# Patient Record
Sex: Male | Born: 1975 | Race: Black or African American | Hispanic: No | Marital: Married | Smoking: Never smoker
Health system: Southern US, Community
[De-identification: ages and names within clinical notes are randomized; demographics above are authoritative.]

---

## 2005-12-13 HISTORY — PX: KNEE ARTHROSCOPY: SUR90

## 2014-06-05 ENCOUNTER — Ambulatory Visit (INDEPENDENT_AMBULATORY_CARE_PROVIDER_SITE_OTHER): Payer: Commercial Indemnity

## 2014-06-05 DIAGNOSIS — Z23 Encounter for immunization: Secondary | ICD-10-CM

## 2014-06-05 DIAGNOSIS — B20 Human immunodeficiency virus [HIV] disease: Secondary | ICD-10-CM

## 2014-06-05 LAB — CBC WITH DIFFERENTIAL/PLATELET
Basophils Absolute: 0 10*3/uL (ref 0.0–0.1)
Basophils Relative: 1 % (ref 0–1)
EOS PCT: 1 % (ref 0–5)
Eosinophils Absolute: 0 10*3/uL (ref 0.0–0.7)
HCT: 40.9 % (ref 39.0–52.0)
HEMOGLOBIN: 14 g/dL (ref 13.0–17.0)
Lymphocytes Relative: 42 % (ref 12–46)
Lymphs Abs: 1.6 10*3/uL (ref 0.7–4.0)
MCH: 31.6 pg (ref 26.0–34.0)
MCHC: 34.2 g/dL (ref 30.0–36.0)
MCV: 92.3 fL (ref 78.0–100.0)
MONOS PCT: 7 % (ref 3–12)
Monocytes Absolute: 0.3 10*3/uL (ref 0.1–1.0)
NEUTROS ABS: 1.9 10*3/uL (ref 1.7–7.7)
Neutrophils Relative %: 49 % (ref 43–77)
Platelets: 243 10*3/uL (ref 150–400)
RBC: 4.43 MIL/uL (ref 4.22–5.81)
RDW: 13.1 % (ref 11.5–15.5)
WBC: 3.9 10*3/uL — ABNORMAL LOW (ref 4.0–10.5)

## 2014-06-05 LAB — LIPID PANEL
CHOL/HDL RATIO: 2.2 ratio
Cholesterol: 139 mg/dL (ref 0–200)
HDL: 63 mg/dL (ref >39)
LDL CALC: 67 mg/dL (ref 0–99)
TRIGLYCERIDES: 45 mg/dL (ref <150)
VLDL: 9 mg/dL (ref 0–40)

## 2014-06-05 LAB — COMPLETE METABOLIC PANEL WITH GFR
ALBUMIN: 4.5 g/dL (ref 3.5–5.2)
ALT: 22 U/L (ref 0–53)
AST: 24 U/L (ref 0–37)
Alkaline Phosphatase: 53 U/L (ref 39–117)
BUN: 14 mg/dL (ref 6–23)
CALCIUM: 9.6 mg/dL (ref 8.4–10.5)
CHLORIDE: 105 meq/L (ref 96–112)
CO2: 29 mEq/L (ref 19–32)
CREATININE: 1.03 mg/dL (ref 0.50–1.35)
GFR, Est African American: >89 mL/min
GFR, Est Non African American: >89 mL/min
GLUCOSE: 82 mg/dL (ref 70–99)
POTASSIUM: 4.5 meq/L (ref 3.5–5.3)
Sodium: 140 mEq/L (ref 135–145)
Total Bilirubin: 0.5 mg/dL (ref 0.2–1.2)
Total Protein: 7.3 g/dL (ref 6.0–8.3)

## 2014-06-06 LAB — HEPATITIS C ANTIBODY: HCV Ab: NEGATIVE

## 2014-06-06 LAB — HEPATITIS B SURFACE ANTIGEN: HEP B S AG: NEGATIVE

## 2014-06-06 LAB — RPR

## 2014-06-06 LAB — T-HELPER CELL (CD4) - (RCID CLINIC ONLY)
CD4 % Helper T Cell: 46 % (ref 33–55)
CD4 T Cell Abs: 710 /uL (ref 400–2700)

## 2014-06-06 LAB — HEPATITIS A ANTIBODY, TOTAL: Hep A Total Ab: REACTIVE — AB

## 2014-06-06 LAB — HEPATITIS B CORE ANTIBODY, TOTAL: Hep B Core Total Ab: NONREACTIVE

## 2014-06-06 LAB — HEPATITIS B SURFACE ANTIBODY,QUALITATIVE: Hep B S Ab: POSITIVE — AB

## 2014-06-07 LAB — HIV-1 RNA ULTRAQUANT REFLEX TO GENTYP+
HIV 1 RNA Quant: 3320 copies/mL — ABNORMAL HIGH (ref <20)
HIV-1 RNA Quant, Log: 3.52 {Log} — ABNORMAL HIGH (ref <1.30)

## 2014-06-11 LAB — HLA B*5701: HLA-B*5701 w/rflx HLA-B High: NEGATIVE

## 2014-06-13 NOTE — Progress Notes (Signed)
Patient is here today after testing positive for HIV on 05-20-14. His last negative test was 03-2013 . He states he is "devastated" by this new diagnosis and is focusing on getting treatment.   He was married to a male for several years and after the breakup he became sexually active with males. He has no tattoos or piecings. No medical records.  Vaccines updated.   Laurell Josephsammy K Chandlor Noecker, RN

## 2014-06-19 LAB — HIV-1 GENOTYPR PLUS

## 2014-06-24 ENCOUNTER — Ambulatory Visit: Payer: Commercial Indemnity | Admitting: Internal Medicine

## 2014-07-08 ENCOUNTER — Telehealth: Payer: Self-pay | Admitting: Licensed Clinical Social Worker

## 2014-07-08 NOTE — Telephone Encounter (Signed)
Will make sure to treat him on that day

## 2014-07-08 NOTE — Telephone Encounter (Signed)
Tamika from the Health Department was checking to make sure patient is treated for syphilis because he was listed as a contact from a positive syphilis patient. Patient is coming in on 7/29

## 2014-07-10 ENCOUNTER — Encounter: Payer: Self-pay | Admitting: Internal Medicine

## 2014-07-10 ENCOUNTER — Ambulatory Visit (INDEPENDENT_AMBULATORY_CARE_PROVIDER_SITE_OTHER): Payer: Commercial Indemnity | Admitting: Internal Medicine

## 2014-07-10 VITALS — BP 117/75 | HR 51 | Temp 97.3°F | Wt 156.0 lb

## 2014-07-10 DIAGNOSIS — Z202 Contact with and (suspected) exposure to infections with a predominantly sexual mode of transmission: Secondary | ICD-10-CM | POA: Insufficient documentation

## 2014-07-10 DIAGNOSIS — B20 Human immunodeficiency virus [HIV] disease: Secondary | ICD-10-CM | POA: Insufficient documentation

## 2014-07-10 DIAGNOSIS — Z21 Asymptomatic human immunodeficiency virus [HIV] infection status: Secondary | ICD-10-CM | POA: Insufficient documentation

## 2014-07-10 DIAGNOSIS — A539 Syphilis, unspecified: Secondary | ICD-10-CM

## 2014-07-10 MED ORDER — PENICILLIN G BENZATHINE 1200000 UNIT/2ML IM SUSP
1.2000 10*6.[IU] | Freq: Once | INTRAMUSCULAR | Status: AC
  Administered 2014-07-10: 1.2 10*6.[IU] via INTRAMUSCULAR

## 2014-07-10 MED ORDER — ABACAVIR-DOLUTEGRAVIR-LAMIVUD 600-50-300 MG PO TABS: 1.0000 | ORAL_TABLET | Freq: Every day | ORAL | Status: DC

## 2014-07-10 NOTE — Progress Notes (Signed)
Subjective:    Patient ID: Casandra DoffingRolondo Whitmoyer, male    DOB: 11/11/1976, 38 y.o.   MRN: [id number]  HPI  38yo M, moved from [place] ( where he lived there for 13 yrs) but family from [place] originally. He has recently diagnosed for HIV, and recently told he was a contact of syphilis, roughly 3 month. He is married to a woman and has 2 kids ( age 40 and 209 yo daughters). He has recently come to understand that his sexual preference is for men. He has been routinely tested for HIV in April 2015 (-), jan 2015 (-). He believes that the male partner he may have contracted HIV is a person that he had limited sexual contact, Abron being the insertive partner. Raydon was contacted by health dept to let him know that he was a sexual contact to syphilis case. He states that none of the male sexual contacts or his wife have tested positive. Last sexually active with his wife roughly 3 years ago. 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  He denies any recent flu like illness, or need to for antibiotics. No recent hospitalizations. He does not take medicines routinely ? Difficulty with swallowing pills. No health problems  No current outpatient prescriptions on file prior to visit.   No current facility-administered medications on file prior to visit.   No Known Allergies  Active Ambulatory Problems    Diagnosis Date Noted  . No Active Ambulatory Problems   Resolved Ambulatory Problems    Diagnosis Date Noted  . No Resolved Ambulatory Problems   No Additional Past Medical History   History  Substance Use Topics  . Smoking status: Never Smoker   . Smokeless tobacco: Not on file  . Alcohol Use: 1.2 oz/week    2 Glasses of wine per week  family history includes Cancer in his paternal grandmother; Diabetes in his father; Hypertension in his father.   Review of  Systems  Constitutional: Negative for fever, chills, diaphoresis, activity change, appetite change, fatigue and unexpected weight change.  HENT: Negative for congestion, sore throat, rhinorrhea, sneezing, trouble swallowing and sinus pressure.  Eyes: Negative for photophobia and visual disturbance.  Respiratory: Negative for cough, chest tightness, shortness of breath, wheezing and stridor.  Cardiovascular: Negative for chest pain, palpitations and leg swelling.  Gastrointestinal: Negative for nausea, vomiting, abdominal pain, diarrhea, constipation, blood in stool, abdominal distention and anal bleeding.  Genitourinary: Negative for dysuria, hematuria, flank pain and difficulty urinating.  Musculoskeletal: Negative for myalgias, back pain, joint swelling, arthralgias and gait problem.  Skin: Negative for color change, pallor, rash and wound.  Neurological: Negative for dizziness, tremors, weakness and light-headedness.  Hematological: Negative for adenopathy. Does not bruise/bleed easily.  Psychiatric/Behavioral: Negative for behavioral problems, confusion, sleep disturbance, dysphoric mood, decreased concentration and agitation.       Objective:   Physical Exam BP 117/75  Pulse 51  Temp(Src) 97.3 F (36.3 C) (Oral)  Wt 156 lb (70.761 kg) Physical Exam  Constitutional: He is oriented to person, place, and time. He appears well-developed and well-nourished. No distress.  HENT:  Mouth/Throat: Oropharynx is clear and moist. No oropharyngeal exudate.  Cardiovascular: Normal rate, regular rhythm and normal heart sounds. Exam reveals no gallop and no friction rub.  No murmur heard.  Pulmonary/Chest: Effort normal and breath sounds normal. No respiratory distress. He has no wheezes.  Abdominal: Soft. Bowel sounds  are normal. He exhibits no distension. There is no tenderness.  Lymphadenopathy:  He has no cervical adenopathy.  Neurological: He is alert and oriented to person, place, and  time.  Skin: Skin is warm and dry. No rash noted. No erythema.  Psychiatric: He has a normal mood and affect. His behavior is normal.   Labs: Lab Results  Component Value Date   CD4TABS 710 06/05/2014   Lab Results  Component Value Date   HIV1RNAQUANT 3320* 06/05/2014       Assessment & Plan:  38yo M with recent diagnosis of HIV, CD 4 count of 710/VL 3320, hep A immune, Hep B immune, interested in establishing care and starting ART.  HIV = will start him on triumeq since he does not want to gain weight as has been seen with stribild. Recommended apple sauce to help with swallowing meds. Reviewed the importance of adherence  Health maintenance = already received pneumococcal, to get flu vaccine in the Fall  Syphilis exposure = will give 1 shot of penicillin IM today  rtc in 5 wks

## 2014-08-14 ENCOUNTER — Ambulatory Visit (INDEPENDENT_AMBULATORY_CARE_PROVIDER_SITE_OTHER): Payer: Commercial Indemnity | Admitting: Internal Medicine

## 2014-08-14 ENCOUNTER — Encounter: Payer: Self-pay | Admitting: Internal Medicine

## 2014-08-14 VITALS — BP 114/66 | HR 52 | Temp 97.2°F | Wt 154.0 lb

## 2014-08-14 DIAGNOSIS — Z23 Encounter for immunization: Secondary | ICD-10-CM

## 2014-08-14 DIAGNOSIS — Z0000 Encounter for general adult medical examination without abnormal findings: Secondary | ICD-10-CM

## 2014-08-14 DIAGNOSIS — Z21 Asymptomatic human immunodeficiency virus [HIV] infection status: Secondary | ICD-10-CM

## 2014-08-14 LAB — CBC WITH DIFFERENTIAL/PLATELET
BASOS ABS: 0 10*3/uL (ref 0.0–0.1)
Basophils Relative: 1 % (ref 0–1)
Eosinophils Absolute: 0 10*3/uL (ref 0.0–0.7)
Eosinophils Relative: 1 % (ref 0–5)
HEMATOCRIT: 42.5 % (ref 39.0–52.0)
HEMOGLOBIN: 14.3 g/dL (ref 13.0–17.0)
Lymphocytes Relative: 34 % (ref 12–46)
Lymphs Abs: 1.4 10*3/uL (ref 0.7–4.0)
MCH: 32.1 pg (ref 26.0–34.0)
MCHC: 33.6 g/dL (ref 30.0–36.0)
MCV: 95.3 fL (ref 78.0–100.0)
MONOS PCT: 9 % (ref 3–12)
Monocytes Absolute: 0.4 10*3/uL (ref 0.1–1.0)
NEUTROS ABS: 2.3 10*3/uL (ref 1.7–7.7)
Neutrophils Relative %: 55 % (ref 43–77)
Platelets: 263 10*3/uL (ref 150–400)
RBC: 4.46 MIL/uL (ref 4.22–5.81)
RDW: 14.3 % (ref 11.5–15.5)
WBC: 4.1 10*3/uL (ref 4.0–10.5)

## 2014-08-14 LAB — URINALYSIS
Bilirubin Urine: NEGATIVE
Glucose, UA: NEGATIVE mg/dL
HGB URINE DIPSTICK: NEGATIVE
Ketones, ur: NEGATIVE mg/dL
LEUKOCYTES UA: NEGATIVE
NITRITE: NEGATIVE
Protein, ur: NEGATIVE mg/dL
SPECIFIC GRAVITY, URINE: 1.018 (ref 1.005–1.030)
Urobilinogen, UA: 0.2 mg/dL (ref 0.0–1.0)
pH: 6 (ref 5.0–8.0)

## 2014-08-14 LAB — COMPLETE METABOLIC PANEL WITH GFR
ALT: 29 U/L (ref 0–53)
AST: 28 U/L (ref 0–37)
Albumin: 4.3 g/dL (ref 3.5–5.2)
Alkaline Phosphatase: 55 U/L (ref 39–117)
BILIRUBIN TOTAL: 0.4 mg/dL (ref 0.2–1.2)
BUN: 22 mg/dL (ref 6–23)
CO2: 29 mEq/L (ref 19–32)
CREATININE: 1.35 mg/dL (ref 0.50–1.35)
Calcium: 9.3 mg/dL (ref 8.4–10.5)
Chloride: 102 mEq/L (ref 96–112)
GFR, Est African American: 75 mL/min
GFR, Est Non African American: 65 mL/min
Glucose, Bld: 75 mg/dL (ref 70–99)
Potassium: 4.5 mEq/L (ref 3.5–5.3)
Sodium: 138 mEq/L (ref 135–145)
Total Protein: 7.3 g/dL (ref 6.0–8.3)

## 2014-08-14 NOTE — Progress Notes (Signed)
Patient ID: Josecarlos Harriott, male   DOB: 1976-09-21, 38 y.o.   MRN: [id number]       Patient ID: Chaska Hagger, male   DOB: January 08, 1976, 38 y.o.   MRN: [id number]  HPI  38yo M with HIV disease, diagnosed in Spring 2015, CD 4 count of 710/VL3320. Also hx of syphilis exposure. We started him on ABC/DLG/lamivudine in late July. He has had good adherence. Repeats only missing 1 dose since starting meds, due to traveling.   Outpatient Encounter Prescriptions as of 08/14/2014  Medication Sig  . Abacavir-Dolutegravir-Lamivud (TRIUMEQ) 600-50-300 MG TABS Take 1 tablet by mouth daily.     Patient Active Problem List   Diagnosis Date Noted  . Asymptomatic HIV infection 07/10/2014  . Exposure to syphilis 07/10/2014     Health Maintenance Due  Topic Date Due  . Tetanus/tdap  05/21/1993  . Influenza Vaccine  07/13/2014     Review of Systems 10 point ros negative Physical Exam   BP 114/66  Pulse 52  Temp(Src) 97.2 F (36.2 C) (Oral)  Wt 154 lb (69.854 kg)  Constitutional: He is oriented to person, place, and time. He appears well-developed and well-nourished. No distress.  Skin: Skin is warm and dry. No rash noted. No erythema.  Psychiatric: He has a normal mood and affect. behavior is normal.    Lab Results  Component Value Date   CD4TCELL 46 06/05/2014   Lab Results  Component Value Date   CD4TABS 710 06/05/2014   Lab Results  Component Value Date   HIV1RNAQUANT 3320* 06/05/2014   Lab Results  Component Value Date   HEPBSAB POS* 06/05/2014   No results found for this basename: RPR    CBC Lab Results  Component Value Date   WBC 3.9* 06/05/2014   RBC 4.43 06/05/2014   HGB 14.0 06/05/2014   HCT 40.9 06/05/2014   PLT 243 06/05/2014   MCV 92.3 06/05/2014   MCH 31.6 06/05/2014   MCHC 34.2 06/05/2014   RDW 13.1 06/05/2014   LYMPHSABS 1.6 06/05/2014   MONOABS 0.3 06/05/2014   EOSABS 0.0 06/05/2014   BASOSABS 0.0 06/05/2014   BMET Lab Results  Component Value Date   NA 140 06/05/2014    K 4.5 06/05/2014   CL 105 06/05/2014   CO2 29 06/05/2014   GLUCOSE 82 06/05/2014   BUN 14 06/05/2014   CREATININE 1.03 06/05/2014   CALCIUM 9.6 06/05/2014   GFRNONAA >89 06/05/2014   GFRAA >89 06/05/2014     Assessment and Plan  hiv = will check labs today to see that he is virally suppressed. Continue on triomeq. Review importance of adherence. Gave keychain pill holder  Health maintenance = will get flu shot today, also check urine studies  Syphilis exposure = will check rpr at next visit  hiv prevention = promote condom use. Previously married with kids, but this is a more recent exposure due to sex with MSM.

## 2014-08-15 LAB — T-HELPER CELL (CD4) - (RCID CLINIC ONLY)
CD4 T CELL HELPER: 44 % (ref 33–55)
CD4 T Cell Abs: 630 /uL (ref 400–2700)

## 2014-08-16 LAB — HIV-1 RNA QUANT-NO REFLEX-BLD
HIV 1 RNA Quant: <20 copies/mL (ref <20)
HIV-1 RNA Quant, Log: <1.3 {Log} (ref <1.30)

## 2014-08-21 ENCOUNTER — Encounter: Payer: Self-pay | Admitting: Internal Medicine

## 2014-11-14 ENCOUNTER — Ambulatory Visit: Payer: Commercial Indemnity | Admitting: Internal Medicine

## 2014-12-06 ENCOUNTER — Telehealth: Payer: Self-pay | Admitting: Infectious Disease

## 2014-12-06 ENCOUNTER — Other Ambulatory Visit: Payer: Self-pay | Admitting: Infectious Disease

## 2014-12-06 DIAGNOSIS — T7840XA Allergy, unspecified, initial encounter: Secondary | ICD-10-CM

## 2014-12-06 MED ORDER — PREDNISONE 20 MG PO TABS: 40.0000 mg | ORAL_TABLET | Freq: Every day | ORAL | Status: DC

## 2014-12-06 NOTE — Telephone Encounter (Signed)
Patient called and sounds to be having a potential ABACAVIR Hypersentivity reaction despite HLA B701 negative.  He had prior sensation of rash but now head to toe rash and severe abdominal pain and diarrhea.   Rx cipro by PCP  I said stop TRIUMEQ (add to allergy list)  Start prednisone 53m daily  Zyrtec daily  Benadryl prn and to ED if worsens  He should be seen on Monday January 4th with labs CMP, CBC  c diff, RPR  and would change to GENVOYA likely

## 2014-12-09 ENCOUNTER — Other Ambulatory Visit: Payer: Commercial Indemnity

## 2014-12-09 DIAGNOSIS — Z113 Encounter for screening for infections with a predominantly sexual mode of transmission: Secondary | ICD-10-CM

## 2014-12-09 DIAGNOSIS — T7840XA Allergy, unspecified, initial encounter: Secondary | ICD-10-CM

## 2014-12-09 DIAGNOSIS — B20 Human immunodeficiency virus [HIV] disease: Secondary | ICD-10-CM

## 2014-12-09 DIAGNOSIS — Z21 Asymptomatic human immunodeficiency virus [HIV] infection status: Secondary | ICD-10-CM

## 2014-12-09 LAB — COMPLETE METABOLIC PANEL WITH GFR
ALBUMIN: 4 g/dL (ref 3.5–5.2)
ALK PHOS: 79 U/L (ref 39–117)
ALT: 22 U/L (ref 0–53)
AST: 21 U/L (ref 0–37)
BILIRUBIN TOTAL: 0.3 mg/dL (ref 0.2–1.2)
BUN: 10 mg/dL (ref 6–23)
CO2: 32 mEq/L (ref 19–32)
Calcium: 9.3 mg/dL (ref 8.4–10.5)
Chloride: 102 mEq/L (ref 96–112)
Creat: 1.16 mg/dL (ref 0.50–1.35)
GFR, EST NON AFRICAN AMERICAN: 78 mL/min
GFR, Est African American: >89 mL/min
GLUCOSE: 82 mg/dL (ref 70–99)
POTASSIUM: 4.4 meq/L (ref 3.5–5.3)
SODIUM: 139 meq/L (ref 135–145)
TOTAL PROTEIN: 6.7 g/dL (ref 6.0–8.3)

## 2014-12-09 NOTE — Telephone Encounter (Signed)
Per VD note from 12/06/14 called the patient to check on him and was advised the rash is better but that he will be out of town 12/12/14-12/18/14 and wants to come and have the labs now if possible. Advised him of the hours today and advised him to come when he can. Also advised him to watch out for the things he and Dr Daiva EvesVan Dam discussed 12/06/14.

## 2014-12-09 NOTE — Telephone Encounter (Signed)
Very good. We DO need to come up with a new reigmen for him though. He will need to do that when he comes back into town

## 2014-12-10 LAB — CBC WITH DIFFERENTIAL/PLATELET
BASOS ABS: 0 10*3/uL (ref 0.0–0.1)
Basophils Relative: 0 % (ref 0–1)
EOS PCT: 3 % (ref 0–5)
Eosinophils Absolute: 0.2 10*3/uL (ref 0.0–0.7)
HCT: 39.2 % (ref 39.0–52.0)
Hemoglobin: 13.3 g/dL (ref 13.0–17.0)
Lymphocytes Relative: 21 % (ref 12–46)
Lymphs Abs: 1.7 10*3/uL (ref 0.7–4.0)
MCH: 32.7 pg (ref 26.0–34.0)
MCHC: 33.9 g/dL (ref 30.0–36.0)
MCV: 96.3 fL (ref 78.0–100.0)
MPV: 9.1 fL — ABNORMAL LOW (ref 9.4–12.4)
Monocytes Absolute: 0.7 10*3/uL (ref 0.1–1.0)
Monocytes Relative: 9 % (ref 3–12)
Neutro Abs: 5.3 10*3/uL (ref 1.7–7.7)
Neutrophils Relative %: 67 % (ref 43–77)
PLATELETS: 289 10*3/uL (ref 150–400)
RBC: 4.07 MIL/uL — ABNORMAL LOW (ref 4.22–5.81)
RDW: 12.8 % (ref 11.5–15.5)
WBC: 7.9 10*3/uL (ref 4.0–10.5)

## 2014-12-10 LAB — RPR

## 2014-12-10 LAB — T-HELPER CELL (CD4) - (RCID CLINIC ONLY)
CD4 % Helper T Cell: 40 % (ref 33–55)
CD4 T CELL ABS: 660 /uL (ref 400–2700)

## 2014-12-10 NOTE — Telephone Encounter (Signed)
Patient was reminded of and encouraged to keep his appt set for 12/25/14 with Dr Drue SecondSnider. He advised he will do so and call if anything changes.

## 2014-12-12 LAB — HIV-1 RNA QUANT-NO REFLEX-BLD: HIV 1 RNA Quant: <20 copies/mL (ref <20)

## 2014-12-16 NOTE — Telephone Encounter (Signed)
Very good 

## 2014-12-25 ENCOUNTER — Ambulatory Visit (INDEPENDENT_AMBULATORY_CARE_PROVIDER_SITE_OTHER): Payer: Commercial Indemnity | Admitting: Internal Medicine

## 2014-12-25 ENCOUNTER — Encounter: Payer: Self-pay | Admitting: Internal Medicine

## 2014-12-25 VITALS — BP 132/74 | HR 65 | Temp 98.4°F | Wt 150.0 lb

## 2014-12-25 DIAGNOSIS — Z21 Asymptomatic human immunodeficiency virus [HIV] infection status: Secondary | ICD-10-CM

## 2014-12-25 DIAGNOSIS — M25541 Pain in joints of right hand: Secondary | ICD-10-CM

## 2014-12-25 DIAGNOSIS — L239 Allergic contact dermatitis, unspecified cause: Secondary | ICD-10-CM

## 2014-12-25 DIAGNOSIS — L200 Besnier's prurigo: Secondary | ICD-10-CM

## 2014-12-25 DIAGNOSIS — M79641 Pain in right hand: Secondary | ICD-10-CM

## 2014-12-25 DIAGNOSIS — M25542 Pain in joints of left hand: Secondary | ICD-10-CM

## 2014-12-25 DIAGNOSIS — M79642 Pain in left hand: Secondary | ICD-10-CM

## 2014-12-25 MED ORDER — ELVITEG-COBIC-EMTRICIT-TENOFDF 150-150-200-300 MG PO TABS: 1.0000 | ORAL_TABLET | Freq: Every day | ORAL | Status: DC

## 2014-12-25 NOTE — Progress Notes (Signed)
    Patient ID: Jerome Robbins, male   DOB: [birth year], 39 y.o.   MRN: [id number]  HPI 39yo M with HIV disease, had been on triomeq but over christmas develop full body pruritic rash which resolved with steroids. He was asked to stop taking triumeq Since there was concern that this could be related to ABC hypersensitivity although HLA B5701 negative. He had not had any new exposure to medicaitons, foods, detergents. He states that he recalls having epigastric abdominal pain and drying rash across brigde of nose weeks prior to developing full body rash. He is not recovered from the episode. He denies any residual rash at this time. Not having any epigastric pain, nor any diarrhea which he had for 3 days.  Outpatient Encounter Prescriptions as of 12/25/2014  Medication Sig  . elvitegravir-cobicistat-emtricitabine-tenofovir (STRIBILD) 150-150-200-300 MG TABS tablet Take 1 tablet by mouth daily with breakfast.  . [DISCONTINUED] predniSONE (DELTASONE) 20 MG tablet Take 2 tablets (40 mg total) by mouth daily with breakfast.     Patient Active Problem List   Diagnosis Date Noted  . Asymptomatic HIV infection 07/10/2014  . Exposure to syphilis 07/10/2014     Health Maintenance Due  Topic Date Due  . Samul Dada  05/21/1993     Review of Systems Bilateral achiness of fingers x 2 days.otherwise 10 point ros is negative  Physical Exam   BP 132/74 mmHg  Pulse 65  Temp(Src) 98.4 F (36.9 C) (Oral)  Wt 150 lb (68.04 kg) gen = a xo by 3 in nad HEENT = clear OP Skin = skin is intact, no visible rash Ext = joints of hands/fingers are not swollen or erythematous  Lab Results  Component Value Date   CD4TCELL 40 12/09/2014   Lab Results  Component Value Date   CD4TABS 660 12/09/2014   CD4TABS 630 08/14/2014   CD4TABS 710 06/05/2014   Lab Results  Component Value Date   HIV1RNAQUANT <20 12/09/2014   Lab Results  Component Value Date   HEPBSAB POS* 06/05/2014   No results found  for: RPR  CBC Lab Results  Component Value Date   WBC 7.9 12/09/2014   RBC 4.07* 12/09/2014   HGB 13.3 12/09/2014   HCT 39.2 12/09/2014   PLT 289 12/09/2014   MCV 96.3 12/09/2014   MCH 32.7 12/09/2014   MCHC 33.9 12/09/2014   RDW 12.8 12/09/2014   LYMPHSABS 1.7 12/09/2014   MONOABS 0.7 12/09/2014   EOSABS 0.2 12/09/2014   BASOSABS 0.0 12/09/2014   BMET Lab Results  Component Value Date   NA 139 12/09/2014   K 4.4 12/09/2014   CL 102 12/09/2014   CO2 32 12/09/2014   GLUCOSE 82 12/09/2014   BUN 10 12/09/2014   CREATININE 1.16 12/09/2014   CALCIUM 9.3 12/09/2014   GFRNONAA 78 12/09/2014   GFRAA >89 12/09/2014    Assessment and Plan  HIV = will start him on stribild. Gave instruction to how to take medicaitons. Anticipate maybe having nausea to clear on its own in 1 wk  Allergic dermatitis = unclear which component of triumeq caused his rash. If he has recurrent rash, it may be due to integrase inhibitor class  Arthralgias of hands = he recently had labs checked, electrolytes are WNL. Asked him to check to see how long it last before  rtc in 4 wk to see how he is doing on stribild

## 2015-01-29 ENCOUNTER — Ambulatory Visit: Payer: Commercial Indemnity | Admitting: Infectious Disease

## 2015-01-31 ENCOUNTER — Encounter: Payer: Self-pay | Admitting: Infectious Diseases

## 2015-01-31 ENCOUNTER — Ambulatory Visit (INDEPENDENT_AMBULATORY_CARE_PROVIDER_SITE_OTHER): Payer: Commercial Indemnity | Admitting: Infectious Diseases

## 2015-01-31 VITALS — BP 129/82 | HR 62 | Temp 98.2°F | Wt 152.0 lb

## 2015-01-31 DIAGNOSIS — Z79899 Other long term (current) drug therapy: Secondary | ICD-10-CM

## 2015-01-31 DIAGNOSIS — B20 Human immunodeficiency virus [HIV] disease: Secondary | ICD-10-CM

## 2015-01-31 DIAGNOSIS — Z113 Encounter for screening for infections with a predominantly sexual mode of transmission: Secondary | ICD-10-CM

## 2015-01-31 NOTE — Assessment & Plan Note (Addendum)
Is not sexually active. Is doing well on his current ART.  His vax are up to date.  He got a 10 day course of doxy after his recent surgery, told that he looked like he had "an infection".   He has questions about injectable forms of ART. Would be interested in clinical trial.  Offered/refused condoms.  He will return to clinic in 3-4 months with labs prior.

## 2015-01-31 NOTE — Progress Notes (Signed)
   Subjective:    Patient ID: Jerome Robbins, male    DOB: Dec 10, 1976, 39 y.o.   MRN: [id number]  HPI 39 yo M with HIV who was started on triumeq in December of 2015 and then developed severe rash, diarrhea. He took steroid course. He is HLA (-). He was then changed to stribild January 2016.  Has been doing well with.   Had fisurectomy last month.   HIV 1 RNA QUANT (copies/mL)  Date Value  12/09/2014 <20  08/14/2014 <20  06/05/2014 3320*   CD4 T CELL ABS (/uL)  Date Value  12/09/2014 660  08/14/2014 630  06/05/2014 710     Review of Systems  Constitutional: Negative for appetite change and unexpected weight change.  Gastrointestinal: Negative for diarrhea and constipation.  Genitourinary: Negative for difficulty urinating.  Skin: Negative for rash.      Objective:   Physical Exam  Constitutional: He appears well-developed and well-nourished.  HENT:  Mouth/Throat: No oropharyngeal exudate.  Eyes: EOM are normal. Pupils are equal, round, and reactive to light.  Neck: Neck supple.  Cardiovascular: Normal rate, regular rhythm and normal heart sounds.   Pulmonary/Chest: Effort normal and breath sounds normal.  Abdominal: Soft. Bowel sounds are normal. He exhibits no distension. There is no tenderness.  Lymphadenopathy:    He has no cervical adenopathy.      Assessment & Plan:

## 2015-05-15 ENCOUNTER — Other Ambulatory Visit: Payer: Commercial Indemnity

## 2015-06-02 ENCOUNTER — Ambulatory Visit: Payer: Commercial Indemnity | Admitting: Internal Medicine

## 2015-06-13 ENCOUNTER — Telehealth: Payer: Self-pay | Admitting: *Deleted

## 2015-06-13 NOTE — Telephone Encounter (Signed)
Health Insurance changed, needing PA for Stribild.  PA completed on-line and sent to [employer], new insurance.  May take up to 5 business days for response.  Pt informed.  Currently on vacation in [place].    [place]

## 2015-06-17 ENCOUNTER — Other Ambulatory Visit: Payer: Self-pay | Admitting: Licensed Clinical Social Worker

## 2015-06-17 ENCOUNTER — Other Ambulatory Visit: Payer: Commercial Indemnity

## 2015-06-17 ENCOUNTER — Encounter: Payer: Self-pay | Admitting: *Deleted

## 2015-06-17 NOTE — Telephone Encounter (Addendum)
PA approved until 06/12/2016.  Pt notified by [employer] message.

## 2015-06-23 ENCOUNTER — Other Ambulatory Visit: Payer: Commercial Indemnity

## 2015-06-25 ENCOUNTER — Other Ambulatory Visit: Payer: Managed Care, Other (non HMO)

## 2015-06-25 DIAGNOSIS — B20 Human immunodeficiency virus [HIV] disease: Secondary | ICD-10-CM

## 2015-06-25 DIAGNOSIS — A539 Syphilis, unspecified: Secondary | ICD-10-CM | POA: Insufficient documentation

## 2015-06-25 DIAGNOSIS — Z79899 Other long term (current) drug therapy: Secondary | ICD-10-CM

## 2015-06-25 DIAGNOSIS — Z113 Encounter for screening for infections with a predominantly sexual mode of transmission: Secondary | ICD-10-CM

## 2015-06-25 LAB — LIPID PANEL
CHOL/HDL RATIO: 2 ratio
CHOLESTEROL: 118 mg/dL (ref 0–200)
HDL: 59 mg/dL (ref >=40)
LDL Cholesterol: 52 mg/dL (ref 0–99)
Triglycerides: 34 mg/dL (ref <150)
VLDL: 7 mg/dL (ref 0–40)

## 2015-06-25 LAB — CBC
HEMATOCRIT: 37.3 % — AB (ref 39.0–52.0)
Hemoglobin: 12.7 g/dL — ABNORMAL LOW (ref 13.0–17.0)
MCH: 32.4 pg (ref 26.0–34.0)
MCHC: 34 g/dL (ref 30.0–36.0)
MCV: 95.2 fL (ref 78.0–100.0)
MPV: 9.8 fL (ref 8.6–12.4)
PLATELETS: 278 10*3/uL (ref 150–400)
RBC: 3.92 MIL/uL — ABNORMAL LOW (ref 4.22–5.81)
RDW: 13.9 % (ref 11.5–15.5)
WBC: 5.2 10*3/uL (ref 4.0–10.5)

## 2015-06-25 LAB — COMPLETE METABOLIC PANEL WITH GFR
ALT: 46 U/L (ref 0–53)
AST: 36 U/L (ref 0–37)
Albumin: 3.8 g/dL (ref 3.5–5.2)
Alkaline Phosphatase: 129 U/L — ABNORMAL HIGH (ref 39–117)
BUN: 15 mg/dL (ref 6–23)
CHLORIDE: 101 meq/L (ref 96–112)
CO2: 30 mEq/L (ref 19–32)
Calcium: 9.1 mg/dL (ref 8.4–10.5)
Creat: 1.27 mg/dL (ref 0.50–1.35)
GFR, EST AFRICAN AMERICAN: 81 mL/min
GFR, EST NON AFRICAN AMERICAN: 70 mL/min
Glucose, Bld: 82 mg/dL (ref 70–99)
POTASSIUM: 4.7 meq/L (ref 3.5–5.3)
SODIUM: 140 meq/L (ref 135–145)
TOTAL PROTEIN: 7.1 g/dL (ref 6.0–8.3)
Total Bilirubin: 0.6 mg/dL (ref 0.2–1.2)

## 2015-06-25 LAB — RPR TITER

## 2015-06-25 LAB — RPR: RPR Ser Ql: REACTIVE — AB

## 2015-06-26 LAB — T-HELPER CELL (CD4) - (RCID CLINIC ONLY)
CD4 T CELL HELPER: 44 % (ref 33–55)
CD4 T Cell Abs: 580 /uL (ref 400–2700)

## 2015-06-26 LAB — HIV-1 RNA QUANT-NO REFLEX-BLD: HIV 1 RNA Quant: <20 copies/mL (ref <20)

## 2015-06-26 LAB — URINE CYTOLOGY ANCILLARY ONLY
CHLAMYDIA, DNA PROBE: NEGATIVE
Neisseria Gonorrhea: NEGATIVE

## 2015-06-26 LAB — FLUORESCENT TREPONEMAL AB(FTA)-IGG-BLD: Fluorescent Treponemal ABS: REACTIVE — AB

## 2015-07-01 ENCOUNTER — Ambulatory Visit: Payer: Commercial Indemnity | Admitting: Internal Medicine

## 2015-07-02 ENCOUNTER — Ambulatory Visit (INDEPENDENT_AMBULATORY_CARE_PROVIDER_SITE_OTHER): Payer: Managed Care, Other (non HMO) | Admitting: *Deleted

## 2015-07-02 ENCOUNTER — Telehealth: Payer: Self-pay | Admitting: *Deleted

## 2015-07-02 DIAGNOSIS — A539 Syphilis, unspecified: Secondary | ICD-10-CM | POA: Diagnosis not present

## 2015-07-02 MED ORDER — PENICILLIN G BENZATHINE 1200000 UNIT/2ML IM SUSP
1.2000 10*6.[IU] | Freq: Once | INTRAMUSCULAR | Status: AC
  Administered 2015-07-02: 1.2 10*6.[IU] via INTRAMUSCULAR

## 2015-07-02 NOTE — Telephone Encounter (Signed)
Patient notified and nurse visit scheduled. Jerome MolaJacqueline Becket Robbins

## 2015-07-02 NOTE — Telephone Encounter (Signed)
-----   Message from Ginnie SmartJeffrey C Hatcher, MD sent at 06/26/2015  2:41 PM EDT ----- Pt needs IM Benzathine PEN G 2.4 million units IM x 1 thansk

## 2015-07-08 ENCOUNTER — Encounter: Payer: Self-pay | Admitting: Internal Medicine

## 2015-07-08 ENCOUNTER — Ambulatory Visit (INDEPENDENT_AMBULATORY_CARE_PROVIDER_SITE_OTHER): Payer: Managed Care, Other (non HMO) | Admitting: Internal Medicine

## 2015-07-08 VITALS — BP 126/85 | HR 83 | Temp 98.7°F | Wt 158.0 lb

## 2015-07-08 DIAGNOSIS — B20 Human immunodeficiency virus [HIV] disease: Secondary | ICD-10-CM

## 2015-07-08 DIAGNOSIS — A539 Syphilis, unspecified: Secondary | ICD-10-CM

## 2015-07-08 NOTE — Progress Notes (Signed)
Patient ID: Jerome Robbins, male   DOB: 1976/09/26, 39 y.o.   MRN: [id number]       Patient ID: Jerome Robbins, male   DOB: 1976-07-05, 39 y.o.   MRN: [id number]  HPI Jerome Robbins is a 39yo m with HIV disease, CD 4 count of 580/VL<20 (mid July) also recently found to have exposure to syphilis with RPR 1:16, given 1 dose IM PCN. He is doing well with stribild. Previously had rash and diarrhea from triumeq despite HLA(-), which is now completely resolved. He feels that he is doing ok, felt that recent exposure to syphilis, making him reconsider his actions? And safer sex practices. He has not noticed any weight gain with stribild  Outpatient Encounter Prescriptions as of 07/08/2015  Medication Sig  . elvitegravir-cobicistat-emtricitabine-tenofovir (STRIBILD) 150-150-200-300 MG TABS tablet Take 1 tablet by mouth daily with breakfast.   No facility-administered encounter medications on file as of 07/08/2015.     Patient Active Problem List   Diagnosis Date Noted  . HIV disease 07/10/2014  . Exposure to syphilis 07/10/2014     Health Maintenance Due  Topic Date Due  . TETANUS/TDAP  05/21/1993     Review of Systems  Constitutional: Negative for fever, chills, diaphoresis, activity change, appetite change, fatigue and unexpected weight change.  HENT: Negative for congestion, sore throat, rhinorrhea, sneezing, trouble swallowing and sinus pressure.  Eyes: Negative for photophobia and visual disturbance.  Respiratory: Negative for cough, chest tightness, shortness of breath, wheezing and stridor.  Cardiovascular: Negative for chest pain, palpitations and leg swelling.  Gastrointestinal: Negative for nausea, vomiting, abdominal pain, diarrhea, constipation, blood in stool, abdominal distention and anal bleeding.  Genitourinary: Negative for dysuria, hematuria, flank pain and difficulty urinating.  Musculoskeletal: Negative for myalgias, back pain, joint swelling, arthralgias and gait problem.  Skin:  Negative for color change, pallor, rash and wound.  Neurological: Negative for dizziness, tremors, weakness and light-headedness.  Hematological: Negative for adenopathy. Does not bruise/bleed easily.  Psychiatric/Behavioral: Negative for behavioral problems, confusion, sleep disturbance, dysphoric mood, decreased concentration and agitation.    Physical Exam   BP 126/85 mmHg  Pulse 83  Temp(Src) 98.7 F (37.1 C) (Oral)  Wt 158 lb (71.668 kg) Physical Exam  Constitutional: He is oriented to person, place, and time. He appears well-developed and well-nourished. No distress.  HENT:  Mouth/Throat: Oropharynx is clear and moist. No oropharyngeal exudate.  He has no cervical adenopathy.  Skin: Skin is warm and dry. No rash noted. No erythema.  Psychiatric: He has a normal mood and affect. His behavior is normal.    Lab Results  Component Value Date   CD4TCELL 44 06/25/2015   Lab Results  Component Value Date   CD4TABS 580 06/25/2015   CD4TABS 660 12/09/2014   CD4TABS 630 08/14/2014   Lab Results  Component Value Date   HIV1RNAQUANT <20 06/25/2015   Lab Results  Component Value Date   HEPBSAB POS* 06/05/2014   No results found for: RPR  CBC Lab Results  Component Value Date   WBC 5.2 06/25/2015   RBC 3.92* 06/25/2015   HGB 12.7* 06/25/2015   HCT 37.3* 06/25/2015   PLT 278 06/25/2015   MCV 95.2 06/25/2015   MCH 32.4 06/25/2015   MCHC 34.0 06/25/2015   RDW 13.9 06/25/2015   LYMPHSABS 1.7 12/09/2014   MONOABS 0.7 12/09/2014   EOSABS 0.2 12/09/2014   BASOSABS 0.0 12/09/2014   BMET Lab Results  Component Value Date   NA 140 06/25/2015   K 4.7  06/25/2015   CL 101 06/25/2015   CO2 30 06/25/2015   GLUCOSE 82 06/25/2015   BUN 15 06/25/2015   CREATININE 1.27 06/25/2015   CALCIUM 9.1 06/25/2015   GFRNONAA 70 06/25/2015   GFRAA 81 06/25/2015   REACTIVE (07/13 0946)   Assessment and Plan  hiv disease= we discussed changing to genvoya and the benefits TAF  based regimen. He is thinking about it and consider to change at next visit. He is well controlled with hiv viral load and no difficulty with stribild. 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 Received 1 dose of IM PCN. He feels his exposure was from recent trip to [place]. Discussed national syphilis outbreak and importance of safe sex  Elevated creatinine = patient does body building, high muscle mass. Likely baseline of 1.0 from pcp labs. Will continue to follow. Repeat BMP in 6 wk to discuss changing to genvoya as well.     Spent 30 min with patient with greater than 50% in counseling about hiv treatment changes, impact on kidney function, and safe sex regarding syphilis

## 2015-08-18 ENCOUNTER — Other Ambulatory Visit: Payer: Self-pay | Admitting: Internal Medicine

## 2015-09-22 ENCOUNTER — Other Ambulatory Visit: Payer: Managed Care, Other (non HMO)

## 2015-09-22 DIAGNOSIS — Z8619 Personal history of other infectious and parasitic diseases: Secondary | ICD-10-CM

## 2015-09-22 DIAGNOSIS — B20 Human immunodeficiency virus [HIV] disease: Secondary | ICD-10-CM

## 2015-09-22 LAB — CBC WITH DIFFERENTIAL/PLATELET
BASOS PCT: 0 % (ref 0–1)
Basophils Absolute: 0 10*3/uL (ref 0.0–0.1)
EOS ABS: 0 10*3/uL (ref 0.0–0.7)
Eosinophils Relative: 0 % (ref 0–5)
HCT: 41.3 % (ref 39.0–52.0)
HEMOGLOBIN: 13.8 g/dL (ref 13.0–17.0)
Lymphocytes Relative: 31 % (ref 12–46)
Lymphs Abs: 1.1 10*3/uL (ref 0.7–4.0)
MCH: 31.5 pg (ref 26.0–34.0)
MCHC: 33.4 g/dL (ref 30.0–36.0)
MCV: 94.3 fL (ref 78.0–100.0)
MPV: 9.4 fL (ref 8.6–12.4)
Monocytes Absolute: 0.4 10*3/uL (ref 0.1–1.0)
Monocytes Relative: 10 % (ref 3–12)
NEUTROS ABS: 2.1 10*3/uL (ref 1.7–7.7)
NEUTROS PCT: 59 % (ref 43–77)
PLATELETS: 249 10*3/uL (ref 150–400)
RBC: 4.38 MIL/uL (ref 4.22–5.81)
RDW: 13.1 % (ref 11.5–15.5)
WBC: 3.6 10*3/uL — AB (ref 4.0–10.5)

## 2015-09-22 LAB — COMPLETE METABOLIC PANEL WITH GFR
ALBUMIN: 4.3 g/dL (ref 3.6–5.1)
ALT: 19 U/L (ref 9–46)
AST: 21 U/L (ref 10–40)
Alkaline Phosphatase: 57 U/L (ref 40–115)
BILIRUBIN TOTAL: 0.3 mg/dL (ref 0.2–1.2)
BUN: 17 mg/dL (ref 7–25)
CHLORIDE: 105 mmol/L (ref 98–110)
CO2: 30 mmol/L (ref 20–31)
CREATININE: 1.24 mg/dL (ref 0.60–1.35)
Calcium: 9.6 mg/dL (ref 8.6–10.3)
GFR, Est African American: 83 mL/min (ref >=60)
GFR, Est Non African American: 72 mL/min (ref >=60)
Glucose, Bld: 84 mg/dL (ref 65–99)
Potassium: 4.4 mmol/L (ref 3.5–5.3)
SODIUM: 140 mmol/L (ref 135–146)
TOTAL PROTEIN: 6.9 g/dL (ref 6.1–8.1)

## 2015-09-22 NOTE — Addendum Note (Signed)
Addended by: Mariea Clonts D on: 09/22/2015 11:55 AM   Modules accepted: Orders

## 2015-09-23 ENCOUNTER — Other Ambulatory Visit: Payer: Managed Care, Other (non HMO)

## 2015-09-23 LAB — FLUORESCENT TREPONEMAL AB(FTA)-IGG-BLD: Fluorescent Treponemal ABS: REACTIVE — AB

## 2015-09-23 LAB — RPR: RPR Ser Ql: REACTIVE — AB

## 2015-09-23 LAB — RPR TITER: RPR Titer: 1:2 {titer}

## 2015-09-23 LAB — T-HELPER CELL (CD4) - (RCID CLINIC ONLY)
CD4 T CELL ABS: 580 /uL (ref 400–2700)
CD4 T CELL HELPER: 48 % (ref 33–55)

## 2015-09-24 LAB — HIV-1 RNA QUANT-NO REFLEX-BLD
HIV 1 RNA Quant: <20 copies/mL (ref <20)
HIV-1 RNA Quant, Log: <1.3 {Log} (ref <1.30)

## 2015-10-07 ENCOUNTER — Ambulatory Visit (INDEPENDENT_AMBULATORY_CARE_PROVIDER_SITE_OTHER): Payer: Managed Care, Other (non HMO) | Admitting: Internal Medicine

## 2015-10-07 ENCOUNTER — Encounter: Payer: Self-pay | Admitting: Internal Medicine

## 2015-10-07 VITALS — BP 147/73 | HR 60 | Temp 97.8°F | Wt 160.0 lb

## 2015-10-07 DIAGNOSIS — B20 Human immunodeficiency virus [HIV] disease: Secondary | ICD-10-CM

## 2015-10-07 DIAGNOSIS — Z202 Contact with and (suspected) exposure to infections with a predominantly sexual mode of transmission: Secondary | ICD-10-CM

## 2015-10-07 DIAGNOSIS — R030 Elevated blood-pressure reading, without diagnosis of hypertension: Secondary | ICD-10-CM

## 2015-10-07 NOTE — Progress Notes (Signed)
Patient ID: Jerome Robbins, male   DOB: 12-01-1976, 39 y.o.   MRN: [id number]       Patient ID: Jerome Robbins, male   DOB: September 08, 1976, 39 y.o.   MRN: [id number]  HPI  39yo M with HIV disease, CD 4 count of 580/VL<20 on stribild. Hx of syphilis exposure, RPR 1:2 after treatment. He denies missing any doses. He is doing well with his meds. He is wondering if any link with hair loss, though he does report that his maternal uncles have hair loss.  He is undergoing divorce with his wife and trying to adjust to caring with his daughter in split household.  Outpatient Encounter Prescriptions as of 10/07/2015  Medication Sig  . STRIBILD 150-150-200-300 MG TABS tablet TAKE 1 TABLET BY MOUTH DAILY WITH BREAKFAST.   No facility-administered encounter medications on file as of 10/07/2015.     Patient Active Problem List   Diagnosis Date Noted  . HIV disease (HCC) 07/10/2014  . Exposure to syphilis 07/10/2014     Health Maintenance Due  Topic Date Due  . TETANUS/TDAP  05/21/1993  . INFLUENZA VACCINE  07/14/2015     Review of Systems   Constitutional: Negative for fever, chills, diaphoresis, activity change, appetite change, fatigue and unexpected weight change.  HENT: Negative for congestion, sore throat, rhinorrhea, sneezing, trouble swallowing and sinus pressure.  Eyes: Negative for photophobia and visual disturbance.  Respiratory: Negative for cough, chest tightness, shortness of breath, wheezing and stridor.  Cardiovascular: Negative for chest pain, palpitations and leg swelling.  Gastrointestinal: Negative for nausea, vomiting, abdominal pain, diarrhea, constipation, blood in stool, abdominal distention and anal bleeding.  Genitourinary: Negative for dysuria, hematuria, flank pain and difficulty urinating.  Musculoskeletal: Negative for myalgias, back pain, joint swelling, arthralgias and gait problem.  Skin: Negative for color change, pallor, rash and wound.  Neurological: Negative for  dizziness, tremors, weakness and light-headedness.  Hematological: Negative for adenopathy. Does not bruise/bleed easily.  Psychiatric/Behavioral: Negative for behavioral problems, confusion, sleep disturbance, dysphoric mood, decreased concentration and agitation.    Physical Exam   BP 147/73 mmHg  Pulse 60  Temp(Src) 97.8 F (36.6 C) (Oral)  Wt 160 lb (72.576 kg) Physical Exam  Constitutional: He is oriented to person, place, and time. He appears well-developed and well-nourished. No distress.  HENT:  Mouth/Throat: Oropharynx is clear and moist. No oropharyngeal exudate.  Cardiovascular: Normal rate, regular rhythm and normal heart sounds. Exam reveals no gallop and no friction rub.  No murmur heard.  Pulmonary/Chest: Effort normal and breath sounds normal. No respiratory distress. He has no wheezes.  Lymphadenopathy:  He has no cervical adenopathy.  Neurological: He is alert and oriented to person, place, and time.  Skin: Skin is warm and dry. No rash noted. No erythema.  Psychiatric: He has a normal mood and affect. His behavior is normal.    Lab Results  Component Value Date   CD4TCELL 48 09/22/2015   Lab Results  Component Value Date   CD4TABS 580 09/22/2015   CD4TABS 580 06/25/2015   CD4TABS 660 12/09/2014   Lab Results  Component Value Date   HIV1RNAQUANT <20 09/22/2015   Lab Results  Component Value Date   HEPBSAB POS* 06/05/2014   No results found for: RPR  CBC Lab Results  Component Value Date   WBC 3.6* 09/22/2015   RBC 4.38 09/22/2015   HGB 13.8 09/22/2015   HCT 41.3 09/22/2015   PLT 249 09/22/2015   MCV 94.3 09/22/2015   MCH 31.5  09/22/2015   MCHC 33.4 09/22/2015   RDW 13.1 09/22/2015   LYMPHSABS 1.1 09/22/2015   MONOABS 0.4 09/22/2015   EOSABS 0.0 09/22/2015   BASOSABS 0.0 09/22/2015   BMET Lab Results  Component Value Date   NA 140 09/22/2015   K 4.4 09/22/2015   CL 105 09/22/2015   CO2 30 09/22/2015   GLUCOSE 84 09/22/2015   BUN  17 09/22/2015   CREATININE 1.24 09/22/2015   CALCIUM 9.6 09/22/2015   GFRNONAA 72 09/22/2015   GFRAA 83 09/22/2015     Assessment and Plan   hiv disease= will continue with stribild. Will switch to genvoya at the new year when he is changing his insurance  prehypertension = he admits to being nervous likely whitecoat syndrome, will continue to follow at next visit to see if need to add agent  Health maintenance= already received the flu shot  Hx of syphilis = treated, and responded adequately  CC: PCP

## 2015-12-23 ENCOUNTER — Other Ambulatory Visit: Payer: Self-pay | Admitting: Internal Medicine

## 2015-12-23 ENCOUNTER — Other Ambulatory Visit: Payer: Managed Care, Other (non HMO)

## 2015-12-23 DIAGNOSIS — B20 Human immunodeficiency virus [HIV] disease: Secondary | ICD-10-CM

## 2015-12-23 LAB — CBC WITH DIFFERENTIAL/PLATELET
Basophils Absolute: 0 10*3/uL (ref 0.0–0.1)
Basophils Relative: 1 % (ref 0–1)
EOS PCT: 1 % (ref 0–5)
Eosinophils Absolute: 0 10*3/uL (ref 0.0–0.7)
HEMATOCRIT: 40.2 % (ref 39.0–52.0)
HEMOGLOBIN: 13.9 g/dL (ref 13.0–17.0)
LYMPHS ABS: 2 10*3/uL (ref 0.7–4.0)
LYMPHS PCT: 46 % (ref 12–46)
MCH: 32.9 pg (ref 26.0–34.0)
MCHC: 34.6 g/dL (ref 30.0–36.0)
MCV: 95 fL (ref 78.0–100.0)
MONO ABS: 0.4 10*3/uL (ref 0.1–1.0)
MPV: 9.3 fL (ref 8.6–12.4)
Monocytes Relative: 9 % (ref 3–12)
NEUTROS ABS: 1.9 10*3/uL (ref 1.7–7.7)
Neutrophils Relative %: 43 % (ref 43–77)
Platelets: 285 10*3/uL (ref 150–400)
RBC: 4.23 MIL/uL (ref 4.22–5.81)
RDW: 13.2 % (ref 11.5–15.5)
WBC: 4.4 10*3/uL (ref 4.0–10.5)

## 2015-12-23 LAB — COMPREHENSIVE METABOLIC PANEL
ALBUMIN: 4.5 g/dL (ref 3.6–5.1)
ALK PHOS: 64 U/L (ref 40–115)
ALT: 20 U/L (ref 9–46)
AST: 26 U/L (ref 10–40)
BUN: 23 mg/dL (ref 7–25)
CALCIUM: 9.7 mg/dL (ref 8.6–10.3)
CO2: 29 mmol/L (ref 20–31)
Chloride: 102 mmol/L (ref 98–110)
Creat: 1.31 mg/dL (ref 0.60–1.35)
GLUCOSE: 77 mg/dL (ref 65–99)
POTASSIUM: 5.1 mmol/L (ref 3.5–5.3)
Sodium: 139 mmol/L (ref 135–146)
Total Bilirubin: 0.5 mg/dL (ref 0.2–1.2)
Total Protein: 7.3 g/dL (ref 6.1–8.1)

## 2015-12-24 LAB — T-HELPER CELL (CD4) - (RCID CLINIC ONLY)
CD4 T CELL HELPER: 43 % (ref 33–55)
CD4 T Cell Abs: 1020 /uL (ref 400–2700)

## 2015-12-25 LAB — HIV-1 RNA QUANT-NO REFLEX-BLD
HIV 1 RNA Quant: <20 copies/mL (ref <20)
HIV-1 RNA Quant, Log: <1.3 Log copies/mL (ref <1.30)

## 2016-01-06 ENCOUNTER — Ambulatory Visit: Payer: Managed Care, Other (non HMO) | Admitting: Internal Medicine

## 2016-01-13 ENCOUNTER — Ambulatory Visit: Payer: Managed Care, Other (non HMO) | Admitting: Internal Medicine

## 2016-02-17 ENCOUNTER — Ambulatory Visit (INDEPENDENT_AMBULATORY_CARE_PROVIDER_SITE_OTHER): Payer: Managed Care, Other (non HMO) | Admitting: Internal Medicine

## 2016-02-17 ENCOUNTER — Encounter: Payer: Self-pay | Admitting: Internal Medicine

## 2016-02-17 VITALS — BP 135/75 | HR 52 | Temp 97.2°F | Wt 166.0 lb

## 2016-02-17 DIAGNOSIS — A539 Syphilis, unspecified: Secondary | ICD-10-CM

## 2016-02-17 DIAGNOSIS — B20 Human immunodeficiency virus [HIV] disease: Secondary | ICD-10-CM

## 2016-02-17 DIAGNOSIS — R030 Elevated blood-pressure reading, without diagnosis of hypertension: Secondary | ICD-10-CM | POA: Diagnosis not present

## 2016-02-17 NOTE — Progress Notes (Signed)
Patient ID: Jerome Robbins, male   DOB: 1976/08/20, 40 y.o.   MRN: [id number]       Patient ID: Jerome Robbins, male   DOB: 11/17/1976, 40 y.o.   MRN: [id number]  HPI 40yo M with HIV disease, CD 4 count of 1020/VL<@ 20 (jan 2017) on stribild. Previously discussed switching to genvoya at last visit. He states that he is tolerating stribild without difficulty and has not missed a dose. He has had to reschedule a few visits due to traveling for work. He reports doing well overall, no URI this winter.  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 STRIBILD 150-150-200-300 MG TABS tablet TAKE 1 TABLET BY MOUTH DAILY WITH BREAKFAST.   No facility-administered encounter medications on file as of 02/17/2016.     Patient Active Problem List   Diagnosis Date Noted  . HIV disease (HCC) 07/10/2014  . Exposure to syphilis 07/10/2014     Health Maintenance Due  Topic Date Due  . TETANUS/TDAP  05/21/1993     Review of Systems  Constitutional: Negative for fever, chills, diaphoresis, activity change, appetite change, fatigue and unexpected weight change.  HENT: Negative for congestion, sore throat, rhinorrhea, sneezing, trouble swallowing and sinus pressure.  Eyes: Negative for photophobia and visual disturbance.  Respiratory: Negative for cough, chest tightness, shortness of breath, wheezing and stridor.  Cardiovascular: Negative for chest pain, palpitations and leg swelling.  Gastrointestinal: Negative for nausea, vomiting, abdominal pain, diarrhea, constipation, blood in stool, abdominal distention and anal bleeding.  Genitourinary: Negative for dysuria, hematuria, flank pain and difficulty urinating.  Musculoskeletal: Negative for myalgias, back pain, joint swelling, arthralgias and gait problem.  Skin: Negative for color change, pallor, rash and wound.  Neurological: Negative for dizziness, tremors, weakness and  light-headedness.  Hematological: Negative for adenopathy. Does not bruise/bleed easily.  Psychiatric/Behavioral: Negative for behavioral problems, confusion, sleep disturbance, dysphoric mood, decreased concentration and agitation.    Physical Exam  BP 135/75 mmHg  Pulse 52  Temp(Src) 97.2 F (36.2 C) (Oral)  Wt 166 lb (75.297 kg)+ 6 lb since last visit, intentionally Physical Exam  Constitutional: He is oriented to person, place, and time. He appears well-developed and well-nourished. No distress.  HENT:  Mouth/Throat: Oropharynx is clear and moist. No oropharyngeal exudate.  Skin: Skin is warm and dry. No rash noted. No erythema.  Psychiatric: He has a normal mood and affect. His behavior is normal.    Lab Results  Component Value Date   CD4TCELL 43 12/23/2015   Lab Results  Component Value Date   CD4TABS 1020 12/23/2015   CD4TABS 580 09/22/2015   CD4TABS 580 06/25/2015   Lab Results  Component Value Date   HIV1RNAQUANT <20 12/23/2015   Lab Results  Component Value Date   HEPBSAB POS* 06/05/2014   No results found for: RPR  CBC Lab Results  Component Value Date   WBC 4.4 12/23/2015   RBC 4.23 12/23/2015   HGB 13.9 12/23/2015   HCT 40.2 12/23/2015   PLT 285 12/23/2015   MCV 95.0 12/23/2015   MCH 32.9 12/23/2015   MCHC 34.6 12/23/2015   RDW 13.2 12/23/2015   LYMPHSABS 2.0 12/23/2015   MONOABS 0.4 12/23/2015   EOSABS 0.0 12/23/2015   BASOSABS 0.0 12/23/2015   BMET Lab Results  Component Value Date   NA 139 12/23/2015   K 5.1 12/23/2015   CL 102 12/23/2015   CO2 29 12/23/2015  GLUCOSE 77 12/23/2015   BUN 23 12/23/2015   CREATININE 1.31 12/23/2015   CALCIUM 9.7 12/23/2015   GFRNONAA 72 09/22/2015   GFRAA 83 09/22/2015     Assessment and Plan  hiv disease = currently on stribild with excellent control. He is cautions to change and wishes to continue on stribild if Cr is stable. We will continue to follow viral load and cd 45 count and kidney  function to decide when to change to TAF based regimen  Hx of syphilis = will test for RPR and STI at next visit  Health maintenance = up to date on flu as well as hep a and b immune  prehypertension = sBP in 130s.   Weight gain = intentional from weight lifting

## 2016-05-06 ENCOUNTER — Other Ambulatory Visit: Payer: Managed Care, Other (non HMO)

## 2016-05-06 DIAGNOSIS — Z79899 Other long term (current) drug therapy: Secondary | ICD-10-CM

## 2016-05-06 DIAGNOSIS — Z113 Encounter for screening for infections with a predominantly sexual mode of transmission: Secondary | ICD-10-CM

## 2016-05-06 DIAGNOSIS — B20 Human immunodeficiency virus [HIV] disease: Secondary | ICD-10-CM

## 2016-05-06 LAB — COMPREHENSIVE METABOLIC PANEL
ALBUMIN: 4.5 g/dL (ref 3.6–5.1)
ALT: 28 U/L (ref 9–46)
AST: 30 U/L (ref 10–40)
Alkaline Phosphatase: 55 U/L (ref 40–115)
BILIRUBIN TOTAL: 0.7 mg/dL (ref 0.2–1.2)
BUN: 28 mg/dL — ABNORMAL HIGH (ref 7–25)
CO2: 25 mmol/L (ref 20–31)
CREATININE: 1.31 mg/dL (ref 0.60–1.35)
Calcium: 9.7 mg/dL (ref 8.6–10.3)
Chloride: 100 mmol/L (ref 98–110)
Glucose, Bld: 79 mg/dL (ref 65–99)
Potassium: 4.5 mmol/L (ref 3.5–5.3)
SODIUM: 135 mmol/L (ref 135–146)
TOTAL PROTEIN: 7.4 g/dL (ref 6.1–8.1)

## 2016-05-06 LAB — LIPID PANEL
CHOL/HDL RATIO: 2.3 ratio (ref <=5.0)
Cholesterol: 129 mg/dL (ref 125–200)
HDL: 55 mg/dL (ref >=40)
LDL Cholesterol: 66 mg/dL (ref <130)
Triglycerides: 40 mg/dL (ref <150)
VLDL: 8 mg/dL (ref <30)

## 2016-05-06 LAB — CBC WITH DIFFERENTIAL/PLATELET
BASOS ABS: 33 {cells}/uL (ref 0–200)
Basophils Relative: 1 %
EOS PCT: 1 %
Eosinophils Absolute: 33 cells/uL (ref 15–500)
HCT: 43.4 % (ref 38.5–50.0)
Hemoglobin: 14.4 g/dL (ref 13.2–17.1)
LYMPHS PCT: 47 %
Lymphs Abs: 1551 cells/uL (ref 850–3900)
MCH: 32 pg (ref 27.0–33.0)
MCHC: 33.2 g/dL (ref 32.0–36.0)
MCV: 96.4 fL (ref 80.0–100.0)
MPV: 9.7 fL (ref 7.5–12.5)
Monocytes Absolute: 363 cells/uL (ref 200–950)
Monocytes Relative: 11 %
NEUTROS PCT: 40 %
Neutro Abs: 1320 cells/uL — ABNORMAL LOW (ref 1500–7800)
Platelets: 267 10*3/uL (ref 140–400)
RBC: 4.5 MIL/uL (ref 4.20–5.80)
RDW: 13.7 % (ref 11.0–15.0)
WBC: 3.3 10*3/uL — AB (ref 3.8–10.8)

## 2016-05-06 NOTE — Addendum Note (Signed)
Addended byJimmy Picket: ABBITT, KATRINA F on: 05/06/2016 09:21 AM   Modules accepted: Orders

## 2016-05-07 LAB — URINE CYTOLOGY ANCILLARY ONLY
Chlamydia: NEGATIVE
NEISSERIA GONORRHEA: NEGATIVE

## 2016-05-07 LAB — RPR

## 2016-05-07 LAB — T-HELPER CELL (CD4) - (RCID CLINIC ONLY)
CD4 % Helper T Cell: 47 % (ref 33–55)
CD4 T Cell Abs: 800 /uL (ref 400–2700)

## 2016-05-07 LAB — HIV-1 RNA QUANT-NO REFLEX-BLD

## 2016-05-17 ENCOUNTER — Encounter: Payer: Self-pay | Admitting: Internal Medicine

## 2016-05-20 ENCOUNTER — Ambulatory Visit: Payer: Managed Care, Other (non HMO) | Admitting: Internal Medicine

## 2016-06-29 ENCOUNTER — Other Ambulatory Visit: Payer: Self-pay | Admitting: *Deleted

## 2016-06-29 DIAGNOSIS — B20 Human immunodeficiency virus [HIV] disease: Secondary | ICD-10-CM

## 2016-06-30 ENCOUNTER — Other Ambulatory Visit: Payer: Managed Care, Other (non HMO)

## 2016-06-30 DIAGNOSIS — B20 Human immunodeficiency virus [HIV] disease: Secondary | ICD-10-CM

## 2016-06-30 LAB — CBC WITH DIFFERENTIAL/PLATELET
BASOS ABS: 34 {cells}/uL (ref 0–200)
Basophils Relative: 1 %
EOS ABS: 0 {cells}/uL — AB (ref 15–500)
EOS PCT: 0 %
HCT: 41.9 % (ref 38.5–50.0)
Hemoglobin: 14.1 g/dL (ref 13.2–17.1)
LYMPHS PCT: 43 %
Lymphs Abs: 1462 cells/uL (ref 850–3900)
MCH: 32.6 pg (ref 27.0–33.0)
MCHC: 33.7 g/dL (ref 32.0–36.0)
MCV: 97 fL (ref 80.0–100.0)
MONOS PCT: 12 %
MPV: 9.3 fL (ref 7.5–12.5)
Monocytes Absolute: 408 cells/uL (ref 200–950)
NEUTROS PCT: 44 %
Neutro Abs: 1496 cells/uL — ABNORMAL LOW (ref 1500–7800)
PLATELETS: 259 10*3/uL (ref 140–400)
RBC: 4.32 MIL/uL (ref 4.20–5.80)
RDW: 13.4 % (ref 11.0–15.0)
WBC: 3.4 10*3/uL — ABNORMAL LOW (ref 3.8–10.8)

## 2016-06-30 LAB — COMPLETE METABOLIC PANEL WITH GFR
ALT: 15 U/L (ref 9–46)
AST: 19 U/L (ref 10–40)
Albumin: 4.3 g/dL (ref 3.6–5.1)
Alkaline Phosphatase: 49 U/L (ref 40–115)
BILIRUBIN TOTAL: 0.5 mg/dL (ref 0.2–1.2)
BUN: 18 mg/dL (ref 7–25)
CO2: 27 mmol/L (ref 20–31)
Calcium: 9.1 mg/dL (ref 8.6–10.3)
Chloride: 105 mmol/L (ref 98–110)
Creat: 1.27 mg/dL (ref 0.60–1.35)
GFR, EST AFRICAN AMERICAN: 80 mL/min (ref >=60)
GFR, EST NON AFRICAN AMERICAN: 69 mL/min (ref >=60)
Glucose, Bld: 89 mg/dL (ref 65–99)
POTASSIUM: 4.8 mmol/L (ref 3.5–5.3)
SODIUM: 141 mmol/L (ref 135–146)
TOTAL PROTEIN: 7.1 g/dL (ref 6.1–8.1)

## 2016-07-01 LAB — T-HELPER CELL (CD4) - (RCID CLINIC ONLY)
CD4 % Helper T Cell: 46 % (ref 33–55)
CD4 T Cell Abs: 720 /uL (ref 400–2700)

## 2016-07-02 LAB — HIV-1 RNA QUANT-NO REFLEX-BLD: HIV-1 RNA Quant, Log: <1.3 Log copies/mL (ref <1.30)

## 2016-07-05 ENCOUNTER — Ambulatory Visit: Payer: Managed Care, Other (non HMO) | Admitting: Internal Medicine

## 2016-07-08 ENCOUNTER — Encounter: Payer: Self-pay | Admitting: Internal Medicine

## 2016-08-04 ENCOUNTER — Other Ambulatory Visit: Payer: Self-pay | Admitting: Internal Medicine

## 2016-08-04 DIAGNOSIS — B20 Human immunodeficiency virus [HIV] disease: Secondary | ICD-10-CM

## 2016-10-21 ENCOUNTER — Telehealth: Payer: Self-pay | Admitting: *Deleted

## 2016-10-21 NOTE — Telephone Encounter (Signed)
Azzie RoupShane Anderson, NP from patient's PCP office calling with question regarding patient's recent syphilis titers.  He was undetectable here in May 2017.   September RPR was 1:2 10/2 1:8 likely exposure per patient 10/4 given bicillin 2.4 units once at PCP's office. retested 11/6 1:16, likely new exposure per patient.  Please advise testing/treatment from [facility] standpoint. DIS in touch with PCP as well. Please contact [facility] and [facility]. Azzie RoupShane Anderson, NP - [phone] Andree CossHowell, Dione Petron M, RN

## 2016-11-08 ENCOUNTER — Telehealth: Payer: Self-pay

## 2016-11-08 NOTE — Telephone Encounter (Signed)
His primary care physician is concerned since his titers continue to increase. 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    [facility] was [facility] and asked the patient to call the office and have the labs faxed.   After labs are received I will forward not to Dr Drue SecondSnider.  He was scheduled for routing visit on 12-14-2016.\   Laurell Josephsammy K King, RN

## 2016-11-08 NOTE — Telephone Encounter (Signed)
Pt has recently had an exposure to syphilis in November. 2017 . Patient was treated with injection by primary care with three injections.

## 2016-11-29 ENCOUNTER — Other Ambulatory Visit: Payer: Managed Care, Other (non HMO)

## 2016-11-29 DIAGNOSIS — B20 Human immunodeficiency virus [HIV] disease: Secondary | ICD-10-CM

## 2016-11-29 LAB — COMPLETE METABOLIC PANEL WITH GFR
ALBUMIN: 4.3 g/dL (ref 3.6–5.1)
ALK PHOS: 53 U/L (ref 40–115)
ALT: 29 U/L (ref 9–46)
AST: 31 U/L (ref 10–40)
BILIRUBIN TOTAL: 0.5 mg/dL (ref 0.2–1.2)
BUN: 16 mg/dL (ref 7–25)
CO2: 28 mmol/L (ref 20–31)
Calcium: 9.5 mg/dL (ref 8.6–10.3)
Chloride: 103 mmol/L (ref 98–110)
Creat: 1.39 mg/dL — ABNORMAL HIGH (ref 0.60–1.35)
GFR, EST NON AFRICAN AMERICAN: 62 mL/min (ref >=60)
GFR, Est African American: 72 mL/min (ref >=60)
GLUCOSE: 80 mg/dL (ref 65–99)
Potassium: 4.3 mmol/L (ref 3.5–5.3)
SODIUM: 139 mmol/L (ref 135–146)
TOTAL PROTEIN: 7.2 g/dL (ref 6.1–8.1)

## 2016-11-29 LAB — CBC WITH DIFFERENTIAL/PLATELET
Basophils Absolute: 0 cells/uL (ref 0–200)
Basophils Relative: 0 %
EOS ABS: 47 {cells}/uL (ref 15–500)
Eosinophils Relative: 1 %
HEMATOCRIT: 41.6 % (ref 38.5–50.0)
HEMOGLOBIN: 13.8 g/dL (ref 13.2–17.1)
LYMPHS ABS: 1739 {cells}/uL (ref 850–3900)
Lymphocytes Relative: 37 %
MCH: 32.3 pg (ref 27.0–33.0)
MCHC: 33.2 g/dL (ref 32.0–36.0)
MCV: 97.4 fL (ref 80.0–100.0)
MONO ABS: 376 {cells}/uL (ref 200–950)
MPV: 9.3 fL (ref 7.5–12.5)
Monocytes Relative: 8 %
NEUTROS ABS: 2538 {cells}/uL (ref 1500–7800)
Neutrophils Relative %: 54 %
Platelets: 256 10*3/uL (ref 140–400)
RBC: 4.27 MIL/uL (ref 4.20–5.80)
RDW: 13.2 % (ref 11.0–15.0)
WBC: 4.7 10*3/uL (ref 3.8–10.8)

## 2016-11-30 LAB — RPR TITER

## 2016-11-30 LAB — T-HELPER CELL (CD4) - (RCID CLINIC ONLY)
CD4 T CELL ABS: 860 /uL (ref 400–2700)
CD4 T CELL HELPER: 43 % (ref 33–55)

## 2016-11-30 LAB — RPR: RPR Ser Ql: REACTIVE — AB

## 2016-11-30 LAB — FLUORESCENT TREPONEMAL AB(FTA)-IGG-BLD: FLUORESCENT TREPONEMAL ABS: REACTIVE — AB

## 2016-12-01 LAB — HIV-1 RNA QUANT-NO REFLEX-BLD
HIV 1 RNA Quant: <20 copies/mL (ref <20)
HIV-1 RNA Quant, Log: <1.3 Log copies/mL (ref <1.30)

## 2016-12-14 ENCOUNTER — Other Ambulatory Visit (HOSPITAL_COMMUNITY)
Admission: RE | Admit: 2016-12-14 | Discharge: 2016-12-14 | Disposition: A | Discharge status: Home / Self Care | Payer: Managed Care, Other (non HMO) | Source: Ambulatory Visit | Attending: Internal Medicine | Admitting: Internal Medicine

## 2016-12-14 ENCOUNTER — Ambulatory Visit (INDEPENDENT_AMBULATORY_CARE_PROVIDER_SITE_OTHER): Payer: Managed Care, Other (non HMO) | Admitting: Internal Medicine

## 2016-12-14 ENCOUNTER — Encounter: Payer: Self-pay | Admitting: Internal Medicine

## 2016-12-14 VITALS — BP 121/76 | HR 60 | Temp 98.0°F | Ht 67.0 in | Wt 174.0 lb

## 2016-12-14 DIAGNOSIS — B20 Human immunodeficiency virus [HIV] disease: Secondary | ICD-10-CM | POA: Diagnosis not present

## 2016-12-14 DIAGNOSIS — Z113 Encounter for screening for infections with a predominantly sexual mode of transmission: Secondary | ICD-10-CM | POA: Insufficient documentation

## 2016-12-14 DIAGNOSIS — Z23 Encounter for immunization: Secondary | ICD-10-CM

## 2016-12-14 DIAGNOSIS — N182 Chronic kidney disease, stage 2 (mild): Secondary | ICD-10-CM

## 2016-12-14 MED ORDER — ELVITEG-COBIC-EMTRICIT-TENOFAF 150-150-200-10 MG PO TABS: 1.0000 | ORAL_TABLET | Freq: Every day | ORAL | 11 refills | Status: DC

## 2016-12-14 NOTE — Patient Instructions (Signed)
Come back in 4-6 wk after being on new medication to do labs

## 2016-12-14 NOTE — Progress Notes (Signed)
RFV: hiv follow up visit  Patient ID: Jerome Robbins, male   DOB: 1975/12/24, 41 y.o.   MRN: [id number]  HPI  Cd 4 count of 860/VL<20 on stribild. Doing well. He is interested in changing to genvoya. He states that he continues to work out but has not competed in [employer] building this past year but likely this upcoming spring. He has numerous question to the impact of stribild to genvoya in kidney function as well as interpretation of RPR testing. He has not had any recent unprotected sex since being treated for syphilis earlier this year. He spent the holidays with his family.  Outpatient Encounter Prescriptions as of 12/14/2016  Medication Sig  . STRIBILD 150-150-200-300 MG TABS tablet TAKE 1 TABLET BY MOUTH DAILY WITH BREAKFAST.   No facility-administered encounter medications on file as of 12/14/2016.      Patient Active Problem List   Diagnosis Date Noted  . HIV disease (HCC) 07/10/2014  . Exposure to syphilis 07/10/2014     Health Maintenance Due  Topic Date Due  . INFLUENZA VACCINE  07/13/2016     Review of Systems Review of Systems  Constitutional: Negative for fever, chills, diaphoresis, activity change, appetite change, fatigue and unexpected weight change.  HENT: Negative for congestion, sore throat, rhinorrhea, sneezing, trouble swallowing and sinus pressure.  Eyes: Negative for photophobia and visual disturbance.  Respiratory: Negative for cough, chest tightness, shortness of breath, wheezing and stridor.  Cardiovascular: Negative for chest pain, palpitations and leg swelling.  Gastrointestinal: Negative for nausea, vomiting, abdominal pain, diarrhea, constipation, blood in stool, abdominal distention and anal bleeding.  Genitourinary: Negative for dysuria, hematuria, flank pain and difficulty urinating.  Musculoskeletal: Negative for myalgias, back pain, joint swelling, arthralgias and gait problem.  Skin: Negative for color change, pallor, rash and wound.    Neurological: Negative for dizziness, tremors, weakness and light-headedness.  Hematological: Negative for adenopathy. Does not bruise/bleed easily.  Psychiatric/Behavioral: Negative for behavioral problems, confusion, sleep disturbance, dysphoric mood, decreased concentration and agitation.    Physical Exam   BP 121/76   Pulse 60   Temp 98 F (36.7 C)   Ht 5\' 7"  (1.702 m)   Wt 174 lb (78.9 kg)   BMI 27.25 kg/m  Physical Exam  Constitutional: He is oriented to person, place, and time. He appears well-developed and well-nourished. No distress.  HENT:  Mouth/Throat: Oropharynx is clear and moist. No oropharyngeal exudate.  Cardiovascular: Normal rate, regular rhythm and normal heart sounds. Exam reveals no gallop and no friction rub.  No murmur heard.  Pulmonary/Chest: Effort normal and breath sounds normal. No respiratory distress. He has no wheezes.  Abdominal: Soft. Bowel sounds are normal. He exhibits no distension. There is no tenderness.  Lymphadenopathy:  He has no cervical adenopathy.  Neurological: He is alert and oriented to person, place, and time.  Skin: Skin is warm and dry. No rash noted. No erythema.  Psychiatric: He has a normal mood and affect. His behavior is normal.    Lab Results  Component Value Date   CD4TCELL 43 11/29/2016   Lab Results  Component Value Date   CD4TABS 860 11/29/2016   CD4TABS 720 06/30/2016   CD4TABS 800 05/06/2016   Lab Results  Component Value Date   HIV1RNAQUANT <20 11/29/2016   Lab Results  Component Value Date   HEPBSAB POS (A) 06/05/2014   No results found for: RPR  CBC Lab Results  Component Value Date   WBC 4.7 11/29/2016   RBC  4.27 11/29/2016   HGB 13.8 11/29/2016   HCT 41.6 11/29/2016   PLT 256 11/29/2016   MCV 97.4 11/29/2016   MCH 32.3 11/29/2016   MCHC 33.2 11/29/2016   RDW 13.2 11/29/2016   LYMPHSABS 1,739 11/29/2016   MONOABS 376 11/29/2016   EOSABS 47 11/29/2016   BASOSABS 0 11/29/2016    BMET Lab Results  Component Value Date   NA 139 11/29/2016   K 4.3 11/29/2016   CL 103 11/29/2016   CO2 28 11/29/2016   GLUCOSE 80 11/29/2016   BUN 16 11/29/2016   CREATININE 1.39 (H) 11/29/2016   CALCIUM 9.5 11/29/2016   GFRNONAA 62 11/29/2016   GFRAA 72 11/29/2016     Assessment and Plan  hiv = will change him from stribild to genvoya  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 Responded to pcn treatment accordingly in follow up RPR. Will check for sti at next appt  Health maintenance = will give him meningococcal

## 2016-12-15 LAB — URINE CYTOLOGY ANCILLARY ONLY
Chlamydia: NEGATIVE
Neisseria Gonorrhea: NEGATIVE

## 2017-03-16 ENCOUNTER — Other Ambulatory Visit: Payer: Self-pay | Admitting: *Deleted

## 2017-03-16 DIAGNOSIS — B20 Human immunodeficiency virus [HIV] disease: Secondary | ICD-10-CM

## 2017-03-16 MED ORDER — ELVITEG-COBIC-EMTRICIT-TENOFAF 150-150-200-10 MG PO TABS: 1.0000 | ORAL_TABLET | Freq: Every day | ORAL | 11 refills | Status: DC

## 2017-03-22 ENCOUNTER — Other Ambulatory Visit: Payer: Self-pay | Admitting: *Deleted

## 2017-03-22 DIAGNOSIS — B20 Human immunodeficiency virus [HIV] disease: Secondary | ICD-10-CM

## 2017-03-22 MED ORDER — ELVITEG-COBIC-EMTRICIT-TENOFAF 150-150-200-10 MG PO TABS: 1.0000 | ORAL_TABLET | Freq: Every day | ORAL | 11 refills | Status: DC

## 2017-04-19 ENCOUNTER — Other Ambulatory Visit (HOSPITAL_COMMUNITY)
Admission: RE | Admit: 2017-04-19 | Discharge: 2017-04-19 | Disposition: A | Discharge status: Home / Self Care | Payer: Managed Care, Other (non HMO) | Source: Ambulatory Visit | Attending: Internal Medicine | Admitting: Internal Medicine

## 2017-04-19 ENCOUNTER — Ambulatory Visit (INDEPENDENT_AMBULATORY_CARE_PROVIDER_SITE_OTHER): Payer: Managed Care, Other (non HMO) | Admitting: Internal Medicine

## 2017-04-19 ENCOUNTER — Encounter: Payer: Self-pay | Admitting: Internal Medicine

## 2017-04-19 VITALS — BP 113/69 | HR 78 | Temp 98.4°F | Wt 167.4 lb

## 2017-04-19 DIAGNOSIS — B20 Human immunodeficiency virus [HIV] disease: Secondary | ICD-10-CM | POA: Diagnosis not present

## 2017-04-19 DIAGNOSIS — Z8619 Personal history of other infectious and parasitic diseases: Secondary | ICD-10-CM

## 2017-04-19 DIAGNOSIS — Z79899 Other long term (current) drug therapy: Secondary | ICD-10-CM

## 2017-04-19 DIAGNOSIS — R740 Nonspecific elevation of levels of transaminase and lactic acid dehydrogenase [LDH]: Secondary | ICD-10-CM

## 2017-04-19 DIAGNOSIS — R7401 Elevation of levels of liver transaminase levels: Secondary | ICD-10-CM

## 2017-04-19 NOTE — Progress Notes (Signed)
RFV: follow up for hiv disease  Patient ID: Jerome Robbins, male   DOB: 12/19/1975, 41 y.o.   MRN: [id number]  HPI  40yo with HIV disease, CD 4 count of 860/VL<20 ( dec 2017) currently on genvoya. He has been traveling for work but not missing any doses. 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 He went to pcp for annual visit yesterday. He showed me his lab results which are at baseline, except ast 59, but also in setting of strenous body building work out  He had shaving rash on upper thigh that is now improved.  I have reviewed his records [facility] and [facility] [place] [facility]  Outpatient Encounter Prescriptions as of 04/19/2017  Medication Sig  . elvitegravir-cobicistat-emtricitabine-tenofovir (GENVOYA) 150-150-200-10 MG TABS tablet Take 1 tablet by mouth daily with breakfast.   No facility-administered encounter medications on file as of 04/19/2017.      Patient Active Problem List   Diagnosis Date Noted  . HIV disease (HCC) 07/10/2014  . Exposure to syphilis 07/10/2014   Social History   Social History Narrative  . No narrative on file  - no unprotected sex since last visit  There are no preventive care reminders to display for this patient.   Review of Systems  Constitutional: Negative for fever, chills, diaphoresis, activity change, appetite change, fatigue and unexpected weight change.  HENT: Negative for congestion, sore throat, rhinorrhea, sneezing, trouble swallowing and sinus pressure.  Eyes: Negative for photophobia and visual disturbance.  Respiratory: Negative for cough, chest tightness, shortness of breath, wheezing and stridor.  Cardiovascular: Negative for chest pain, palpitations and leg swelling.  Gastrointestinal: Negative for nausea, vomiting, abdominal pain, diarrhea, constipation, blood in stool, abdominal distention and anal bleeding.  Genitourinary: Negative for dysuria, hematuria, flank pain and difficulty urinating.    Musculoskeletal: Negative for myalgias, back pain, joint swelling, arthralgias and gait problem.  Skin: Negative for color change, pallor, rash and wound.  Neurological: Negative for dizziness, tremors, weakness and light-headedness.  Hematological: Negative for adenopathy. Does not bruise/bleed easily.  Psychiatric/Behavioral: Negative for behavioral problems, confusion, sleep disturbance, dysphoric mood, decreased concentration and agitation.    Physical Exam  BP 113/69 (BP Location: Right Arm, Patient Position: Sitting, Cuff Size: Normal)   Pulse 78   Temp 98.4 F (36.9 C) (Oral)   Wt 167 lb 6.4 oz (75.9 kg)   SpO2 97%   BMI 26.22 kg/m  Physical Exam  Constitutional: He is oriented to person, place, and time. He appears well-developed and well-nourished. No distress.  HENT:  Mouth/Throat: Oropharynx is clear and moist. No oropharyngeal exudate.  Cardiovascular: Normal rate, regular rhythm and normal heart sounds. Exam reveals no gallop and no friction rub.  No murmur heard.  Pulmonary/Chest: Effort normal and breath sounds normal. No respiratory distress. He has no wheezes.  Lymphadenopathy:  He has no cervical adenopathy.  Neurological: He is alert and oriented to person, place, and time.  Skin: Skin is warm and dry. No rash noted. No erythema.  Psychiatric: He has a normal mood and affect. His behavior is normal.      Lab Results  Component Value Date   CD4TCELL 43 11/29/2016   Lab Results  Component Value Date   CD4TABS 860 11/29/2016   CD4TABS 720 06/30/2016   CD4TABS 800 05/06/2016   Lab Results  Component Value Date   HIV1RNAQUANT <20 11/29/2016   Lab Results  Component Value Date   HEPBSAB POS (A) 06/05/2014  Lab Results  Component Value Date   LABRPR REACTIVE (A) 11/29/2016    CBC Lab Results  Component Value Date   WBC 4.7 11/29/2016   RBC 4.27 11/29/2016   HGB 13.8 11/29/2016   HCT 41.6 11/29/2016   PLT 256 11/29/2016   MCV 97.4  11/29/2016   MCH 32.3 11/29/2016   MCHC 33.2 11/29/2016   RDW 13.2 11/29/2016   LYMPHSABS 1,739 11/29/2016   MONOABS 376 11/29/2016   EOSABS 47 11/29/2016    BMET Lab Results  Component Value Date   NA 139 11/29/2016   K 4.3 11/29/2016   CL 103 11/29/2016   CO2 28 11/29/2016   GLUCOSE 80 11/29/2016   BUN 16 11/29/2016   CREATININE 1.39 (H) 11/29/2016   CALCIUM 9.5 11/29/2016   GFRNONAA 62 11/29/2016   GFRAA 72 11/29/2016    pcp tamika howell  Assessment and Plan  hiv disease = wiill check hiv labs today for 6 month visit. Continue on current regimen well controlled  Long term medication monitoring = cr is stable. No need to change regimen  Hx of syphilis = will do sti testing  transaminitis = will check hepatic panel at pcp next week. Likely due to working out, he is training for body building

## 2017-04-20 LAB — RPR

## 2017-04-20 LAB — T-HELPER CELL (CD4) - (RCID CLINIC ONLY)
CD4 T CELL HELPER: 56 % — AB (ref 33–55)
CD4 T Cell Abs: 570 /uL (ref 400–2700)

## 2017-04-20 LAB — URINE CYTOLOGY ANCILLARY ONLY
CHLAMYDIA, DNA PROBE: NEGATIVE
Neisseria Gonorrhea: NEGATIVE

## 2017-04-21 LAB — HIV-1 RNA QUANT-NO REFLEX-BLD
HIV 1 RNA QUANT: NOT DETECTED {copies}/mL
HIV-1 RNA QUANT, LOG: NOT DETECTED {Log_copies}/mL

## 2017-10-25 ENCOUNTER — Other Ambulatory Visit (HOSPITAL_COMMUNITY)
Admission: RE | Admit: 2017-10-25 | Discharge: 2017-10-25 | Disposition: A | Discharge status: Home / Self Care | Payer: Managed Care, Other (non HMO) | Source: Ambulatory Visit | Attending: Internal Medicine | Admitting: Internal Medicine

## 2017-10-25 ENCOUNTER — Ambulatory Visit: Payer: Managed Care, Other (non HMO) | Admitting: Internal Medicine

## 2017-10-25 ENCOUNTER — Encounter: Payer: Self-pay | Admitting: Internal Medicine

## 2017-10-25 VITALS — BP 130/84 | HR 60 | Temp 98.7°F | Ht 67.0 in | Wt 173.0 lb

## 2017-10-25 DIAGNOSIS — B20 Human immunodeficiency virus [HIV] disease: Secondary | ICD-10-CM | POA: Diagnosis not present

## 2017-10-25 DIAGNOSIS — Z23 Encounter for immunization: Secondary | ICD-10-CM

## 2017-10-25 DIAGNOSIS — N182 Chronic kidney disease, stage 2 (mild): Secondary | ICD-10-CM | POA: Diagnosis not present

## 2017-10-25 NOTE — Progress Notes (Signed)
Patient ID: Jerome Robbins, male   DOB: 09/29/1976, 41 y.o.   MRN: [id number]  HPI  41yo M with hiv disease, well controlled, CD 4 count of 570/VL<20 on genvoya. Doing well. Traveling a lot for work. No recent illnesses Previously in one relationship. Had unprotected sex during that relationship since our last visit, but no other partners. 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  Outpatient Encounter Medications as of 10/25/2017  Medication Sig  . elvitegravir-cobicistat-emtricitabine-tenofovir (GENVOYA) 150-150-200-10 MG TABS tablet Take 1 tablet by mouth daily with breakfast.   No facility-administered encounter medications on file as of 10/25/2017.      Patient Active Problem List   Diagnosis Date Noted  . HIV disease (HCC) 07/10/2014  . Exposure to syphilis 07/10/2014     Health Maintenance Due  Topic Date Due  . INFLUENZA VACCINE  07/13/2017     Review of Systems  Constitutional: Negative for fever, chills, diaphoresis, activity change, appetite change, fatigue and unexpected weight change.  HENT: Negative for congestion, sore throat, rhinorrhea, sneezing, trouble swallowing and sinus pressure.  Eyes: Negative for photophobia and visual disturbance.  Respiratory: Negative for cough, chest tightness, shortness of breath, wheezing and stridor.  Cardiovascular: Negative for chest pain, palpitations and leg swelling.  Gastrointestinal: Negative for nausea, vomiting, abdominal pain, diarrhea, constipation, blood in stool, abdominal distention and anal bleeding.  Genitourinary: Negative for dysuria, hematuria, flank pain and difficulty urinating.  Musculoskeletal: Negative for myalgias, back pain, joint swelling, arthralgias and gait problem.  Skin: Negative for color change, pallor, rash and wound.  Neurological: Negative for dizziness, tremors, weakness and light-headedness.  Hematological: Negative for adenopathy. Does not bruise/bleed easily.    Psychiatric/Behavioral: Negative for behavioral problems, confusion, sleep disturbance, dysphoric mood, decreased concentration and agitation.    Physical Exam   BP 130/84   Pulse 60   Temp 98.7 F (37.1 C) (Oral)   Ht 5\' 7"  (1.702 m)   Wt 173 lb (78.5 kg)   BMI 27.10 kg/m   Physical Exam  Constitutional: He is oriented to person, place, and time. He appears well-developed and well-nourished. No distress.  HENT:  Mouth/Throat: Oropharynx is clear and moist. No oropharyngeal exudate.  Cardiovascular: Normal rate, regular rhythm and normal heart sounds. Exam reveals no gallop and no friction rub.  No murmur heard.  Pulmonary/Chest: Effort normal and breath sounds normal. No respiratory distress. He has no wheezes.  Lymphadenopathy:  He has no cervical adenopathy.  Neurological: He is alert and oriented to person, place, and time.  Skin: Skin is warm and dry. No rash noted. No erythema.  Psychiatric: He has a normal mood and affect. His behavior is normal.    Lab Results  Component Value Date   CD4TCELL 56 (H) 04/19/2017   Lab Results  Component Value Date   CD4TABS 570 04/19/2017   CD4TABS 860 11/29/2016   CD4TABS 720 06/30/2016   Lab Results  Component Value Date   HIV1RNAQUANT <20 NOT DETECTED 04/19/2017   Lab Results  Component Value Date   HEPBSAB POS (A) 06/05/2014   Lab Results  Component Value Date   LABRPR NON REAC 04/19/2017    CBC Lab Results  Component Value Date   WBC 4.7 11/29/2016   RBC 4.27 11/29/2016   HGB 13.8 11/29/2016   HCT 41.6 11/29/2016   PLT 256 11/29/2016   MCV 97.4 11/29/2016   MCH 32.3 11/29/2016   MCHC 33.2 11/29/2016   RDW 13.2 11/29/2016   LYMPHSABS 1,739  11/29/2016   MONOABS 376 11/29/2016   EOSABS 47 11/29/2016    BMET Lab Results  Component Value Date   NA 139 11/29/2016   K 4.3 11/29/2016   CL 103 11/29/2016   CO2 28 11/29/2016   GLUCOSE 80 11/29/2016   BUN 16 11/29/2016   CREATININE 1.39 (H) 11/29/2016    CALCIUM 9.5 11/29/2016   GFRNONAA 62 11/29/2016   GFRAA 72 11/29/2016      Assessment and Plan  hiv disease= well controlled. 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

## 2017-10-26 LAB — CBC WITH DIFFERENTIAL/PLATELET
Basophils Absolute: 28 cells/uL (ref 0–200)
Basophils Relative: 0.5 %
EOS PCT: 0.7 %
Eosinophils Absolute: 39 cells/uL (ref 15–500)
HEMATOCRIT: 41.3 % (ref 38.5–50.0)
HEMOGLOBIN: 14.1 g/dL (ref 13.2–17.1)
LYMPHS ABS: 1474 {cells}/uL (ref 850–3900)
MCH: 32.8 pg (ref 27.0–33.0)
MCHC: 34.1 g/dL (ref 32.0–36.0)
MCV: 96 fL (ref 80.0–100.0)
MONOS PCT: 7.7 %
MPV: 9.8 fL (ref 7.5–12.5)
NEUTROS PCT: 64.3 %
Neutro Abs: 3537 cells/uL (ref 1500–7800)
Platelets: 252 10*3/uL (ref 140–400)
RBC: 4.3 10*6/uL (ref 4.20–5.80)
RDW: 12.1 % (ref 11.0–15.0)
Total Lymphocyte: 26.8 %
WBC mixed population: 424 cells/uL (ref 200–950)
WBC: 5.5 10*3/uL (ref 3.8–10.8)

## 2017-10-26 LAB — COMPLETE METABOLIC PANEL WITH GFR
AG Ratio: 1.8 (calc) (ref 1.0–2.5)
ALBUMIN MSPROF: 4.5 g/dL (ref 3.6–5.1)
ALKALINE PHOSPHATASE (APISO): 48 U/L (ref 40–115)
ALT: 13 U/L (ref 9–46)
AST: 18 U/L (ref 10–40)
BILIRUBIN TOTAL: 0.5 mg/dL (ref 0.2–1.2)
BUN: 14 mg/dL (ref 7–25)
CHLORIDE: 103 mmol/L (ref 98–110)
CO2: 30 mmol/L (ref 20–32)
CREATININE: 1.11 mg/dL (ref 0.60–1.35)
Calcium: 9.2 mg/dL (ref 8.6–10.3)
GFR, EST AFRICAN AMERICAN: 94 mL/min/{1.73_m2} (ref >=60)
GFR, Est Non African American: 81 mL/min/{1.73_m2} (ref >=60)
GLOBULIN: 2.5 g/dL (ref 1.9–3.7)
Glucose, Bld: 94 mg/dL (ref 65–99)
Potassium: 4.5 mmol/L (ref 3.5–5.3)
SODIUM: 140 mmol/L (ref 135–146)
TOTAL PROTEIN: 7 g/dL (ref 6.1–8.1)

## 2017-10-26 LAB — URINE CYTOLOGY ANCILLARY ONLY
Chlamydia: NEGATIVE
Neisseria Gonorrhea: NEGATIVE

## 2017-10-26 LAB — T-HELPER CELL (CD4) - (RCID CLINIC ONLY)
CD4 T CELL HELPER: 40 % (ref 33–55)
CD4 T Cell Abs: 660 /uL (ref 400–2700)

## 2017-10-26 LAB — RPR: RPR Ser Ql: NONREACTIVE

## 2017-10-27 LAB — HIV-1 RNA QUANT-NO REFLEX-BLD
HIV 1 RNA Quant: <20 copies/mL
HIV-1 RNA Quant, Log: <1.3 Log copies/mL

## 2017-11-01 ENCOUNTER — Encounter: Payer: Self-pay | Admitting: Internal Medicine

## 2017-12-19 ENCOUNTER — Other Ambulatory Visit: Payer: Self-pay | Admitting: *Deleted

## 2017-12-19 DIAGNOSIS — B20 Human immunodeficiency virus [HIV] disease: Secondary | ICD-10-CM

## 2017-12-19 MED ORDER — ELVITEG-COBIC-EMTRICIT-TENOFAF 150-150-200-10 MG PO TABS: 1.0000 | ORAL_TABLET | Freq: Every day | ORAL | 11 refills | Status: DC

## 2017-12-28 ENCOUNTER — Encounter: Payer: Self-pay | Admitting: Internal Medicine

## 2018-04-25 ENCOUNTER — Ambulatory Visit: Payer: Managed Care, Other (non HMO) | Admitting: Internal Medicine

## 2018-04-25 ENCOUNTER — Encounter: Payer: Self-pay | Admitting: Internal Medicine

## 2018-04-25 VITALS — BP 115/73 | HR 48 | Temp 97.7°F | Wt 172.0 lb

## 2018-04-25 DIAGNOSIS — Z8619 Personal history of other infectious and parasitic diseases: Secondary | ICD-10-CM

## 2018-04-25 DIAGNOSIS — N182 Chronic kidney disease, stage 2 (mild): Secondary | ICD-10-CM | POA: Diagnosis not present

## 2018-04-25 DIAGNOSIS — B20 Human immunodeficiency virus [HIV] disease: Secondary | ICD-10-CM | POA: Diagnosis not present

## 2018-04-25 NOTE — Progress Notes (Signed)
RFV: follow up for hiv disease  Patient ID: Jerome Robbins, male   DOB: 1976-11-05, 42 y.o.   MRN: [id number]  HPI Jerome Robbins is a 42yo M with HIV disease, CD 4 count of 660/VL<20 in nov 2018 doing well with genvoya. He denies missing doses. Has been traveling much for his work. No recent illnesses.   Applying for full custody of his girls, from his ex-wife In relationship with HIV + male, undetectable x 9 mo.  Outpatient Encounter Medications as of 04/25/2018  Medication Sig  . elvitegravir-cobicistat-emtricitabine-tenofovir (GENVOYA) 150-150-200-10 MG TABS tablet Take 1 tablet by mouth daily with breakfast.   No facility-administered encounter medications on file as of 04/25/2018.      Patient Active Problem List   Diagnosis Date Noted  . HIV disease (HCC) 07/10/2014  . Exposure to syphilis 07/10/2014   Soc hx:  Social History   Tobacco Use  . Smoking status: Never Smoker  . Smokeless tobacco: Never Used  Substance Use Topics  . Alcohol use: Yes    Alcohol/week: 1.2 oz    Types: 2 Glasses of wine per week  . Drug use: No    There are no preventive care reminders to display for this patient.   Review of Systems Review of Systems  Constitutional: Negative for fever, chills, diaphoresis, activity change, appetite change, fatigue and unexpected weight change.  HENT: Negative for congestion, sore throat, rhinorrhea, sneezing, trouble swallowing and sinus pressure.  Eyes: Negative for photophobia and visual disturbance.  Respiratory: Negative for cough, chest tightness, shortness of breath, wheezing and stridor.  Cardiovascular: Negative for chest pain, palpitations and leg swelling.  Gastrointestinal: Negative for nausea, vomiting, abdominal pain, diarrhea, constipation, blood in stool, abdominal distention and anal bleeding.  Genitourinary: Negative for dysuria, hematuria, flank pain and difficulty urinating.  Musculoskeletal: Negative for myalgias, back pain, joint  swelling, arthralgias and gait problem.  Skin: Negative for color change, pallor, rash and wound.  Neurological: Negative for dizziness, tremors, weakness and light-headedness.  Hematological: Negative for adenopathy. Does not bruise/bleed easily.  Psychiatric/Behavioral: Negative for behavioral problems, confusion, sleep disturbance, dysphoric mood, decreased concentration and agitation.    Physical Exam   BP 115/73   Pulse (!) 48   Temp 97.7 F (36.5 C) (Oral)   Wt 172 lb (78 kg)   BMI 26.94 kg/m   Physical Exam  Constitutional: He is oriented to person, place, and time. He appears well-developed and well-nourished. No distress.  HENT:  Mouth/Throat: Oropharynx is clear and moist. No oropharyngeal exudate.  Cardiovascular: Normal rate, regular rhythm and normal heart sounds. Exam reveals no gallop and no friction rub.  No murmur heard.  Pulmonary/Chest: Effort normal and breath sounds normal. No respiratory distress. He has no wheezes.  Lymphadenopathy:  He has no cervical adenopathy.  Neurological: He is alert and oriented to person, place, and time.  Skin: Skin is warm and dry. No rash noted. No erythema.  Psychiatric: He has a normal mood and affect. His behavior is normal.    Lab Results  Component Value Date   CD4TCELL 40 10/25/2017   Lab Results  Component Value Date   CD4TABS 660 10/25/2017   CD4TABS 570 04/19/2017   CD4TABS 860 11/29/2016   Lab Results  Component Value Date   HIV1RNAQUANT <20 NOT DETECTED 10/25/2017   Lab Results  Component Value Date   HEPBSAB POS (A) 06/05/2014   Lab Results  Component Value Date   LABRPR NON-REACTIVE 10/25/2017    CBC Lab Results  Component Value Date   WBC 5.5 10/25/2017   RBC 4.30 10/25/2017   HGB 14.1 10/25/2017   HCT 41.3 10/25/2017   PLT 252 10/25/2017   MCV 96.0 10/25/2017   MCH 32.8 10/25/2017   MCHC 34.1 10/25/2017   RDW 12.1 10/25/2017   LYMPHSABS 1,474 10/25/2017   MONOABS 376 11/29/2016    EOSABS 39 10/25/2017    BMET Lab Results  Component Value Date   NA 140 10/25/2017   K 4.5 10/25/2017   CL 103 10/25/2017   CO2 30 10/25/2017   GLUCOSE 94 10/25/2017   BUN 14 10/25/2017   CREATININE 1.11 10/25/2017   CALCIUM 9.2 10/25/2017   GFRNONAA 81 10/25/2017   GFRAA 94 10/25/2017     Assessment and Plan   hiv disease= refill genvoya. 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 No need to change regimen at this time  Health maintenance = vaccines are uptodate  Hx of syphilis = will check rpr

## 2018-04-26 LAB — T-HELPER CELL (CD4) - (RCID CLINIC ONLY)
CD4 % Helper T Cell: 45 % (ref 33–55)
CD4 T CELL ABS: 880 /uL (ref 400–2700)

## 2018-04-26 LAB — RPR: RPR: NONREACTIVE

## 2018-04-27 LAB — HIV-1 RNA QUANT-NO REFLEX-BLD
HIV 1 RNA QUANT: NOT DETECTED {copies}/mL
HIV-1 RNA Quant, Log: <1.3 Log copies/mL

## 2018-09-11 DIAGNOSIS — B0089 Other herpesviral infection: Secondary | ICD-10-CM | POA: Insufficient documentation

## 2018-09-15 ENCOUNTER — Other Ambulatory Visit: Payer: Self-pay | Admitting: *Deleted

## 2018-09-15 DIAGNOSIS — B20 Human immunodeficiency virus [HIV] disease: Secondary | ICD-10-CM

## 2018-09-15 MED ORDER — ELVITEG-COBIC-EMTRICIT-TENOFAF 150-150-200-10 MG PO TABS: 1.0000 | ORAL_TABLET | Freq: Every day | ORAL | 5 refills | Status: DC

## 2018-10-01 ENCOUNTER — Other Ambulatory Visit: Payer: Self-pay

## 2018-10-01 ENCOUNTER — Encounter (HOSPITAL_COMMUNITY): Payer: Self-pay | Admitting: Emergency Medicine

## 2018-10-01 ENCOUNTER — Emergency Department (HOSPITAL_COMMUNITY): Payer: BLUE CROSS/BLUE SHIELD

## 2018-10-01 ENCOUNTER — Emergency Department (HOSPITAL_COMMUNITY)
Admission: EM | Admit: 2018-10-01 | Discharge: 2018-10-01 | Disposition: A | Discharge status: Home / Self Care | Payer: BLUE CROSS/BLUE SHIELD | Attending: Emergency Medicine | Admitting: Emergency Medicine

## 2018-10-01 DIAGNOSIS — R079 Chest pain, unspecified: Secondary | ICD-10-CM | POA: Diagnosis present

## 2018-10-01 DIAGNOSIS — B20 Human immunodeficiency virus [HIV] disease: Secondary | ICD-10-CM | POA: Diagnosis not present

## 2018-10-01 DIAGNOSIS — R0789 Other chest pain: Secondary | ICD-10-CM | POA: Diagnosis not present

## 2018-10-01 LAB — BASIC METABOLIC PANEL
Anion gap: 6 (ref 5–15)
BUN: 17 mg/dL (ref 6–20)
CALCIUM: 9.9 mg/dL (ref 8.9–10.3)
CO2: 27 mmol/L (ref 22–32)
CREATININE: 1.39 mg/dL — AB (ref 0.61–1.24)
Chloride: 106 mmol/L (ref 98–111)
GFR calc non Af Amer: >60 mL/min (ref >60)
Glucose, Bld: 96 mg/dL (ref 70–99)
Potassium: 4.4 mmol/L (ref 3.5–5.1)
Sodium: 139 mmol/L (ref 135–145)

## 2018-10-01 LAB — I-STAT TROPONIN, ED: Troponin i, poc: 0.01 ng/mL (ref 0.00–0.08)

## 2018-10-01 LAB — CBC
HEMATOCRIT: 43.9 % (ref 39.0–52.0)
Hemoglobin: 14.8 g/dL (ref 13.0–17.0)
MCH: 32.3 pg (ref 26.0–34.0)
MCHC: 33.7 g/dL (ref 30.0–36.0)
MCV: 95.9 fL (ref 80.0–100.0)
PLATELETS: 235 10*3/uL (ref 150–400)
RBC: 4.58 MIL/uL (ref 4.22–5.81)
RDW: 11.9 % (ref 11.5–15.5)
WBC: 6 10*3/uL (ref 4.0–10.5)
nRBC: 0 % (ref 0.0–0.2)

## 2018-10-01 NOTE — ED Notes (Signed)
Patient transported to X-ray 

## 2018-10-01 NOTE — Discharge Instructions (Signed)
Take 4 over the counter ibuprofen tablets 3 times a day or 2 over-the-counter naproxen tablets twice a day for pain. Also take tylenol 1000mg(2 extra strength) four times a day.    

## 2018-10-01 NOTE — ED Triage Notes (Signed)
Pt reports he was awoke by chest pain this am, also endorses some dizziness and sob. Pt a/ox4 resp e/u, nad.

## 2018-10-01 NOTE — ED Notes (Signed)
Patient verbalizes understanding of discharge instructions. Opportunity for questioning and answers were provided. Armband removed by staff, pt discharged from ED.  

## 2018-10-01 NOTE — ED Provider Notes (Signed)
[facility] EMERGENCY DEPARTMENT Provider Note   CSN: [id number] Arrival date & time: 10/01/18  1418     History   Chief Complaint Chief Complaint  Patient presents with  . Chest Pain    HPI Jerome Robbins is a 42 y.o. male.  42 yo M with a chief complaint of chest pain.  This is pinpoint and been going on since this morning.  He felt like it woke him up.  Describes it is sharp.  Seems to come and go.  Felt that it was somewhat worse with walking his dog and going up a flight of stairs.  Felt slightly more short of breath than he normally does when he did this.  He denies cough or congestion denies trauma.  Denies hemoptysis denies unilateral lower extremity edema denies history of cancer denies prior history of PE or DVT.  Patient did have a flight to [place] about a month ago.  Denies recent hospitalization.  He denies history of MI denies hypertension hyperlipidemia diabetes or family history of MI.  Patient does have HIV.  The history is provided by the patient.  Chest Pain   This is a new problem. The current episode started 6 to 12 hours ago. The problem occurs constantly. The problem has not changed since onset.The pain is present in the substernal region. The pain is at a severity of 3/10. The pain is moderate. The quality of the pain is described as brief, sharp and heavy. The pain does not radiate. Duration of episode(s) is 8 hours. Associated symptoms include shortness of breath. Pertinent negatives include no abdominal pain, no fever, no headaches, no palpitations and no vomiting. He has tried nothing for the symptoms. The treatment provided no relief.  Pertinent negatives for past medical history include no diabetes, no DVT, no hyperlipidemia, no hypertension, no MI and no PE.  Pertinent negatives for family medical history include: no early MI.    History reviewed. No pertinent past medical history.  Patient Active Problem List   Diagnosis Date Noted    . HIV disease (HCC) 07/10/2014  . Exposure to syphilis 07/10/2014    Past Surgical History:  Procedure Laterality Date  . KNEE ARTHROSCOPY Left 2007        Home Medications    Prior to Admission medications   Medication Sig Start Date End Date Taking? Authorizing Provider  elvitegravir-cobicistat-emtricitabine-tenofovir (GENVOYA) 150-150-200-10 MG TABS tablet Take 1 tablet by mouth daily with breakfast. 09/15/18  Yes Judyann Munson, MD    Family History Family History  Problem Relation Age of Onset  . Diabetes Father   . Hypertension Father   . Cancer Paternal Grandmother     Social History Social History   Tobacco Use  . Smoking status: Never Smoker  . Smokeless tobacco: Never Used  Substance Use Topics  . Alcohol use: Yes    Alcohol/week: 2.0 standard drinks    Types: 2 Glasses of wine per week  . Drug use: No     Allergies   Triumeq [abacavir-dolutegravir-lamivud]   Review of Systems Review of Systems  Constitutional: Negative for chills and fever.  HENT: Negative for congestion and facial swelling.   Eyes: Negative for discharge and visual disturbance.  Respiratory: Positive for shortness of breath.   Cardiovascular: Positive for chest pain. Negative for palpitations.  Gastrointestinal: Negative for abdominal pain, diarrhea and vomiting.  Musculoskeletal: Negative for arthralgias and myalgias.  Skin: Negative for color change and rash.  Neurological: Negative for tremors, syncope  and headaches.  Psychiatric/Behavioral: Negative for confusion and dysphoric mood.     Physical Exam Updated Vital Signs BP 132/83 (BP Location: Right Arm)   Pulse 61   Temp 98.8 F (37.1 C) (Oral)   Resp 18   Ht 5\' 11"  (1.803 m)   Wt 83.9 kg   SpO2 97%   BMI 25.80 kg/m   Physical Exam  Constitutional: He is oriented to person, place, and time. He appears well-developed and well-nourished.  HENT:  Head: Normocephalic and atraumatic.  Eyes: Pupils are equal,  round, and reactive to light. EOM are normal.  Neck: Normal range of motion. Neck supple. No JVD present.  Cardiovascular: Normal rate and regular rhythm. Exam reveals no gallop and no friction rub.  No murmur heard. Pulmonary/Chest: No respiratory distress. He has no wheezes. He exhibits tenderness (reproduces patients symptoms).  Abdominal: He exhibits no distension. There is no rebound and no guarding.  Musculoskeletal: Normal range of motion.  Neurological: He is alert and oriented to person, place, and time.  Skin: No rash noted. No pallor.  Psychiatric: He has a normal mood and affect. His behavior is normal.  Nursing note and vitals reviewed.    ED Treatments / Results  Labs (all labs ordered are listed, but only abnormal results are displayed) Labs Reviewed  BASIC METABOLIC PANEL - Abnormal; Notable for the following components:      Result Value   Creatinine, Ser 1.39 (*)    All other components within normal limits  CBC  I-STAT TROPONIN, ED    EKG EKG Interpretation  Date/Time:  Sunday October 01 2018 15:11:01 EDT Ventricular Rate:  62 PR Interval:    QRS Duration: 91 QT Interval:  399 QTC Calculation: 406 R Axis:   77 Text Interpretation:  Sinus rhythm Consider left ventricular hypertrophy No old tracing to compare Confirmed by Jaymond Waage ([id number]) on 10/01/2018 3:24:12 PM Also confirmed by Juanita Devincent ([id number]), editor Cassel, Kerry ([id number])  on 10/01/2018 4:03:49 PM   Radiology Dg Chest 2 View  Result Date: 10/01/2018 CLINICAL DATA:  42 year old male with a history chest pain EXAM: CHEST - 2 VIEW COMPARISON:  None. FINDINGS: The heart size and mediastinal contours are within normal limits. Both lungs are clear. The visualized skeletal structures are unremarkable. IMPRESSION: Negative for acute cardiopulmonary disease. Electronically Signed   By: Jaime  Wagner D.O.   On: 10/01/2018 16:08    Procedures Procedures (including critical care time)  Medications  Ordered in ED Medications - No data to display   Initial Impression / Assessment and Plan / ED Course  I have reviewed the triage vital signs and the nursing notes.  Pertinent labs & imaging results that were available during my care of the patient were reviewed by me and considered in my medical decision making (see chart for details).     42  yo M with a cc of chest pain.  Atypical in nature.  Reproduced on palpation.  This is completely atypical of ACS.  He has had no significant change of his pain since this morning his troponin is negative EKG without concerning findings.  His basic laboratory evaluation with a mild rise of his creatinine but otherwise unremarkable.  His only risk factor for MI as HIV.  He did have recent prolonged travel, but I feel this is unlikely to be a PE based on history and physical.  At this point I feel he safe for discharge home.  Will follow-up with PCP.   11:50 PM:  I have discussed the diagnosis/risks/treatment options with the patient and family and believe the pt to be eligible for discharge home to follow-up with PCP. We also discussed returning to the ED immediately if new or worsening sx occur. We discussed the sx which are most concerning (e.g., sudden worsening pain, fever, inability to tolerate by mouth) that necessitate immediate return. Medications administered to the patient during their visit and any new prescriptions provided to the patient are listed below.  Medications given during this visit Medications - No data to display   The patient appears reasonably screen and/or stabilized for discharge and I doubt any other medical condition or other [facility] requiring further screening, evaluation, or treatment in the ED at this time prior to discharge.     Final Clinical Impressions(s) / ED Diagnoses   Final diagnoses:  Atypical chest pain    ED Discharge Orders    None       Melene Plan, DO 10/01/18 2350

## 2018-10-25 ENCOUNTER — Ambulatory Visit (INDEPENDENT_AMBULATORY_CARE_PROVIDER_SITE_OTHER): Payer: BLUE CROSS/BLUE SHIELD | Admitting: Internal Medicine

## 2018-10-25 ENCOUNTER — Other Ambulatory Visit (HOSPITAL_COMMUNITY)
Admission: RE | Admit: 2018-10-25 | Discharge: 2018-10-25 | Disposition: A | Discharge status: Home / Self Care | Payer: BLUE CROSS/BLUE SHIELD | Source: Ambulatory Visit | Attending: Internal Medicine | Admitting: Internal Medicine

## 2018-10-25 ENCOUNTER — Encounter: Payer: Self-pay | Admitting: Internal Medicine

## 2018-10-25 VITALS — BP 136/86 | HR 59 | Temp 98.2°F | Ht 67.0 in | Wt 189.0 lb

## 2018-10-25 DIAGNOSIS — Z79899 Other long term (current) drug therapy: Secondary | ICD-10-CM | POA: Diagnosis not present

## 2018-10-25 DIAGNOSIS — Z8619 Personal history of other infectious and parasitic diseases: Secondary | ICD-10-CM | POA: Diagnosis not present

## 2018-10-25 DIAGNOSIS — B20 Human immunodeficiency virus [HIV] disease: Secondary | ICD-10-CM

## 2018-10-25 DIAGNOSIS — N182 Chronic kidney disease, stage 2 (mild): Secondary | ICD-10-CM

## 2018-10-25 NOTE — Progress Notes (Signed)
RFV: follow up for hiv disease  Patient ID: Jerome Robbins, male   DOB: 02/04/1976, 42 y.o.   MRN: [id number]  HPI Carollee MassedRolondo is a 42yo M with well controlled hiv disease, CD 4 count of 880/VL<20 in May continues to take genvoya. In good state of health.  He reports having chest pain that was attributed to pectoralis muscle strain, went to ed to rule out ACS  Roughly 3 wks ago, had genital herpes outbreak that cleared on its own. Last outbreak was 3 years ago. Not on any valtrex prn use at the moment  In a steady relationship with hiv + wellcontrolled partner. Getting full custody of his daughters. New job affiliated with [employer]. [place]  Outpatient Encounter Medications as of 10/25/2018  Medication Sig  . elvitegravir-cobicistat-emtricitabine-tenofovir (GENVOYA) 150-150-200-10 MG TABS tablet Take 1 tablet by mouth daily with breakfast.   No facility-administered encounter medications on file as of 10/25/2018.      Patient Active Problem List   Diagnosis Date Noted  . HIV disease (HCC) 07/10/2014  . Exposure to syphilis 07/10/2014     Health Maintenance Due  Topic Date Due  . INFLUENZA VACCINE  07/13/2018    Social History   Tobacco Use  . Smoking status: Never Smoker  . Smokeless tobacco: Never Used  Substance Use Topics  . Alcohol use: Yes    Alcohol/week: 2.0 standard drinks    Types: 2 Glasses of wine per week  . Drug use: No   Review of Systems  Constitutional: Negative for fever, chills, diaphoresis, activity change, appetite change, fatigue and unexpected weight change.  HENT: Negative for congestion, sore throat, rhinorrhea, sneezing, trouble swallowing and sinus pressure.  Eyes: Negative for photophobia and visual disturbance.  Respiratory: Negative for cough, chest tightness, shortness of breath, wheezing and stridor.  Cardiovascular: Negative for chest pain, palpitations and leg swelling.  Gastrointestinal: Negative for nausea,  vomiting, abdominal pain, diarrhea, constipation, blood in stool, abdominal distention and anal bleeding.  Genitourinary: Negative for dysuria, hematuria, flank pain and difficulty urinating.  Musculoskeletal: Negative for myalgias, back pain, joint swelling, arthralgias and gait problem.  Skin: Negative for color change, pallor, rash and wound.  Neurological: Negative for dizziness, tremors, weakness and light-headedness.  Hematological: Negative for adenopathy. Does not bruise/bleed easily.  Psychiatric/Behavioral: Negative for behavioral problems, confusion, sleep disturbance, dysphoric mood, decreased concentration and agitation.    Physical Exam   Ht 5\' 7"  (1.702 m)   Wt 189 lb (85.7 kg)   BMI 29.60 kg/m   gen = a xo by 3 in nad HEENT= EOMI, PERRLA, clear op Neck = no LN, no thyromegaly, supple Cors = pulse +2, equal Skin = no signs of rash. No c/c/e Psych = affect pleasant, speech normal cadence, Lab Results  Component Value Date   CD4TCELL 45 04/25/2018   Lab Results  Component Value Date   CD4TABS 880 04/25/2018   CD4TABS 660 10/25/2017   CD4TABS 570 04/19/2017   Lab Results  Component Value Date   HIV1RNAQUANT <20 NOT DETECTED 04/25/2018   Lab Results  Component Value Date   HEPBSAB POS (A) 06/05/2014   Lab Results  Component Value Date   LABRPR NON-REACTIVE 04/25/2018    CBC Lab Results  Component Value Date   WBC 6.0 10/01/2018   RBC 4.58 10/01/2018   HGB 14.8 10/01/2018   HCT 43.9 10/01/2018   PLT 235 10/01/2018   MCV 95.9 10/01/2018   MCH 32.3 10/01/2018  MCHC 33.7 10/01/2018   RDW 11.9 10/01/2018   LYMPHSABS 1,474 10/25/2017   MONOABS 376 11/29/2016   EOSABS 39 10/25/2017    BMET Lab Results  Component Value Date   NA 139 10/01/2018   K 4.4 10/01/2018   CL 106 10/01/2018   CO2 27 10/01/2018   GLUCOSE 96 10/01/2018   BUN 17 10/01/2018   CREATININE 1.39 (H) 10/01/2018   CALCIUM 9.9 10/01/2018   GFRNONAA >60 10/01/2018   GFRAA >60  10/01/2018      Assessment and Plan  hiv disease = well controlled with great adherence. We will check labs -CD 4 count and VL. Anticipate to continue genvoya  Long term medication management = will check BMP to see cr stable  ckd 2 - will check UA to see if any proteinuria  Hx of genital herpes = will have him call next time if repeat outbreak to get valtrex as needed. Thus far, fairly infrequent to need chronic suppression  Health maintenance = up to date on vaccines

## 2018-10-26 ENCOUNTER — Telehealth: Payer: Self-pay

## 2018-10-26 LAB — URINE CYTOLOGY ANCILLARY ONLY
Chlamydia: NEGATIVE
Neisseria Gonorrhea: NEGATIVE

## 2018-10-26 LAB — T-HELPER CELL (CD4) - (RCID CLINIC ONLY)
CD4 T CELL HELPER: 48 % (ref 33–55)
CD4 T Cell Abs: 740 /uL (ref 400–2700)

## 2018-10-26 NOTE — Telephone Encounter (Signed)
Fax received: Pharmacy needs updated demographic information and insurance to update their records to process Genvoya. Information faxed to [phone]

## 2018-10-28 LAB — CBC WITH DIFFERENTIAL/PLATELET
BASOS PCT: 0.5 %
Basophils Absolute: 19 cells/uL (ref 0–200)
EOS ABS: 19 {cells}/uL (ref 15–500)
Eosinophils Relative: 0.5 %
HCT: 43.5 % (ref 38.5–50.0)
HEMOGLOBIN: 15.1 g/dL (ref 13.2–17.1)
Lymphs Abs: 1577 cells/uL (ref 850–3900)
MCH: 32.8 pg (ref 27.0–33.0)
MCHC: 34.7 g/dL (ref 32.0–36.0)
MCV: 94.4 fL (ref 80.0–100.0)
MONOS PCT: 10.4 %
MPV: 10 fL (ref 7.5–12.5)
NEUTROS ABS: 1790 {cells}/uL (ref 1500–7800)
Neutrophils Relative %: 47.1 %
PLATELETS: 259 10*3/uL (ref 140–400)
RBC: 4.61 10*6/uL (ref 4.20–5.80)
RDW: 12.4 % (ref 11.0–15.0)
Total Lymphocyte: 41.5 %
WBC: 3.8 10*3/uL (ref 3.8–10.8)
WBCMIX: 395 {cells}/uL (ref 200–950)

## 2018-10-28 LAB — COMPLETE METABOLIC PANEL WITH GFR
AG Ratio: 1.5 (calc) (ref 1.0–2.5)
ALBUMIN MSPROF: 4.6 g/dL (ref 3.6–5.1)
ALT: 18 U/L (ref 9–46)
AST: 21 U/L (ref 10–40)
Alkaline phosphatase (APISO): 48 U/L (ref 40–115)
BUN / CREAT RATIO: 15 (calc) (ref 6–22)
BUN: 21 mg/dL (ref 7–25)
CALCIUM: 9.9 mg/dL (ref 8.6–10.3)
CO2: 26 mmol/L (ref 20–32)
Chloride: 105 mmol/L (ref 98–110)
Creat: 1.39 mg/dL — ABNORMAL HIGH (ref 0.60–1.35)
GFR, EST NON AFRICAN AMERICAN: 61 mL/min/{1.73_m2} (ref >=60)
GFR, Est African American: 71 mL/min/{1.73_m2} (ref >=60)
GLUCOSE: 88 mg/dL (ref 65–99)
Globulin: 3.1 g/dL (calc) (ref 1.9–3.7)
Potassium: 4.4 mmol/L (ref 3.5–5.3)
Sodium: 139 mmol/L (ref 135–146)
TOTAL PROTEIN: 7.7 g/dL (ref 6.1–8.1)
Total Bilirubin: 0.6 mg/dL (ref 0.2–1.2)

## 2018-10-28 LAB — URINALYSIS
BILIRUBIN URINE: NEGATIVE
GLUCOSE, UA: NEGATIVE
Hgb urine dipstick: NEGATIVE
KETONES UR: NEGATIVE
Leukocytes, UA: NEGATIVE
Nitrite: NEGATIVE
PH: 6 (ref 5.0–8.0)
Protein, ur: NEGATIVE
Specific Gravity, Urine: 1.021 (ref 1.001–1.03)

## 2018-10-28 LAB — HIV-1 RNA QUANT-NO REFLEX-BLD
HIV 1 RNA QUANT: NOT DETECTED {copies}/mL
HIV-1 RNA QUANT, LOG: NOT DETECTED {Log_copies}/mL

## 2018-10-28 LAB — RPR: RPR: NONREACTIVE

## 2019-02-27 ENCOUNTER — Other Ambulatory Visit: Payer: Self-pay | Admitting: Internal Medicine

## 2019-02-27 DIAGNOSIS — B20 Human immunodeficiency virus [HIV] disease: Secondary | ICD-10-CM

## 2019-04-23 ENCOUNTER — Ambulatory Visit (INDEPENDENT_AMBULATORY_CARE_PROVIDER_SITE_OTHER): Payer: BLUE CROSS/BLUE SHIELD | Admitting: Internal Medicine

## 2019-04-23 ENCOUNTER — Other Ambulatory Visit: Payer: Self-pay

## 2019-04-23 DIAGNOSIS — N182 Chronic kidney disease, stage 2 (mild): Secondary | ICD-10-CM | POA: Diagnosis not present

## 2019-04-23 DIAGNOSIS — Z8619 Personal history of other infectious and parasitic diseases: Secondary | ICD-10-CM

## 2019-04-23 DIAGNOSIS — B20 Human immunodeficiency virus [HIV] disease: Secondary | ICD-10-CM

## 2019-04-23 NOTE — Progress Notes (Signed)
RFV: telehealth- follow up visit  Patient ID: Jerome Robbins, male   DOB: 1976/10/21, 43 y.o.   MRN: [id number]  HPI 42yo M previously traveled significantly for work however has not traveled for work since mid February. He has been working exclusively from home. 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 No plans for travel. In the process of looking for new home in [place], [place]. He has been in good health. He has not had any close contact covid-19 people, though knows of folks that have it.   No issues with getting medications. No missing doses.   Outpatient Encounter Medications as of 04/23/2019  Medication Sig   GENVOYA 150-150-200-10 MG TABS tablet TAKE 1 TABLET BY MOUTH  DAILY WITH BREAKFAST   No facility-administered encounter medications on file as of 04/23/2019.      Patient Active Problem List   Diagnosis Date Noted   HIV disease (HCC) 07/10/2014   Exposure to syphilis 07/10/2014   Social History   Tobacco Use   Smoking status: Never Smoker   Smokeless tobacco: Never Used  Substance Use Topics   Alcohol use: Yes    Alcohol/week: 2.0 standard drinks    Types: 2 Glasses of wine per week   Drug use: No    There are no preventive care reminders to display for this patient.   Review of Systems  Constitutional: Negative for fever, chills, diaphoresis, activity change, appetite change, fatigue and unexpected weight change.  HENT: Negative for congestion, sore throat, rhinorrhea, sneezing, trouble swallowing and sinus pressure.  Eyes: Negative for photophobia and visual disturbance.  Respiratory: Negative for cough, chest tightness, shortness of breath, wheezing and stridor.  Cardiovascular: Negative for chest pain, palpitations and leg swelling.  Gastrointestinal: Negative for nausea, vomiting, abdominal pain, diarrhea, constipation, blood in stool, abdominal distention and anal bleeding.  Genitourinary: Negative for dysuria, hematuria, flank pain and difficulty  urinating.  Musculoskeletal: Negative for myalgias, back pain, joint swelling, arthralgias and gait problem.  Skin: Negative for color change, pallor, rash and wound.  Neurological: Negative for dizziness, tremors, weakness and light-headedness.  Hematological: Negative for adenopathy. Does not bruise/bleed easily.  Psychiatric/Behavioral: Negative for behavioral problems, confusion, sleep disturbance, dysphoric mood, decreased concentration and agitation.    Physical Exam  No exam from telehealth.  Lab Results  Component Value Date   CD4TCELL 48 10/25/2018   Lab Results  Component Value Date   CD4TABS 740 10/25/2018   CD4TABS 880 04/25/2018   CD4TABS 660 10/25/2017   Lab Results  Component Value Date   HIV1RNAQUANT <20 NOT DETECTED 10/25/2018   Lab Results  Component Value Date   HEPBSAB POS (A) 06/05/2014   Lab Results  Component Value Date   LABRPR NON-REACTIVE 10/25/2018    CBC Lab Results  Component Value Date   WBC 3.8 10/25/2018   RBC 4.61 10/25/2018   HGB 15.1 10/25/2018   HCT 43.5 10/25/2018   PLT 259 10/25/2018   MCV 94.4 10/25/2018   MCH 32.8 10/25/2018   MCHC 34.7 10/25/2018   RDW 12.4 10/25/2018   LYMPHSABS 1,577 10/25/2018   MONOABS 376 11/29/2016   EOSABS 19 10/25/2018    BMET Lab Results  Component Value Date   NA 139 10/25/2018   K 4.4 10/25/2018   CL 105 10/25/2018   CO2 26 10/25/2018   GLUCOSE 88 10/25/2018   BUN 21 10/25/2018   CREATININE 1.39 (H) 10/25/2018   CALCIUM 9.9 10/25/2018   GFRNONAA 61 10/25/2018   GFRAA 71 10/25/2018  Assessment and Plan  hiv disease = well controlled. Will continue with genvoya. Plan to get labs over the next 4 wk  ckd 2= stable. Pt has baseline cr of 1.3. has muscle mass to acct for increase cr. We will check ur electrolytes at next visit  Hx of syphilis = will check RPR at next lab draw  Health maintenance = flu vaccine in the fall

## 2019-06-13 ENCOUNTER — Other Ambulatory Visit: Payer: Self-pay | Admitting: Internal Medicine

## 2019-06-13 DIAGNOSIS — B20 Human immunodeficiency virus [HIV] disease: Secondary | ICD-10-CM

## 2019-11-28 ENCOUNTER — Other Ambulatory Visit: Payer: Self-pay | Admitting: Internal Medicine

## 2019-11-28 DIAGNOSIS — B20 Human immunodeficiency virus [HIV] disease: Secondary | ICD-10-CM

## 2019-12-17 ENCOUNTER — Other Ambulatory Visit: Payer: Self-pay

## 2019-12-17 ENCOUNTER — Other Ambulatory Visit: Payer: BC Managed Care – PPO

## 2019-12-17 ENCOUNTER — Other Ambulatory Visit (HOSPITAL_COMMUNITY)
Admission: RE | Admit: 2019-12-17 | Discharge: 2019-12-17 | Disposition: A | Discharge status: Home / Self Care | Payer: BC Managed Care – PPO | Source: Ambulatory Visit | Attending: Internal Medicine | Admitting: Internal Medicine

## 2019-12-17 DIAGNOSIS — B20 Human immunodeficiency virus [HIV] disease: Secondary | ICD-10-CM

## 2019-12-17 DIAGNOSIS — Z113 Encounter for screening for infections with a predominantly sexual mode of transmission: Secondary | ICD-10-CM

## 2019-12-18 LAB — T-HELPER CELL (CD4) - (RCID CLINIC ONLY)
CD4 % Helper T Cell: 49 % (ref 33–65)
CD4 T Cell Abs: 723 /uL (ref 400–1790)

## 2019-12-18 LAB — URINE CYTOLOGY ANCILLARY ONLY
Chlamydia: NEGATIVE
Comment: NEGATIVE
Comment: NORMAL
Neisseria Gonorrhea: NEGATIVE

## 2019-12-21 LAB — COMPLETE METABOLIC PANEL WITH GFR
AG Ratio: 1.5 (calc) (ref 1.0–2.5)
ALT: 24 U/L (ref 9–46)
AST: 29 U/L (ref 10–40)
Albumin: 4.5 g/dL (ref 3.6–5.1)
Alkaline phosphatase (APISO): 43 U/L (ref 36–130)
BUN/Creatinine Ratio: 18 (calc) (ref 6–22)
BUN: 25 mg/dL (ref 7–25)
CO2: 29 mmol/L (ref 20–32)
Calcium: 9.9 mg/dL (ref 8.6–10.3)
Chloride: 100 mmol/L (ref 98–110)
Creat: 1.41 mg/dL — ABNORMAL HIGH (ref 0.60–1.35)
GFR, Est African American: 69 mL/min/{1.73_m2} (ref >=60)
GFR, Est Non African American: 60 mL/min/{1.73_m2} (ref >=60)
Globulin: 3 g/dL (calc) (ref 1.9–3.7)
Glucose, Bld: 87 mg/dL (ref 65–99)
Potassium: 4.9 mmol/L (ref 3.5–5.3)
Sodium: 136 mmol/L (ref 135–146)
Total Bilirubin: 0.6 mg/dL (ref 0.2–1.2)
Total Protein: 7.5 g/dL (ref 6.1–8.1)

## 2019-12-21 LAB — CBC WITH DIFFERENTIAL/PLATELET
Absolute Monocytes: 435 cells/uL (ref 200–950)
Basophils Absolute: 20 cells/uL (ref 0–200)
Basophils Relative: 0.4 %
Eosinophils Absolute: 60 cells/uL (ref 15–500)
Eosinophils Relative: 1.2 %
HCT: 42.8 % (ref 38.5–50.0)
Hemoglobin: 14.8 g/dL (ref 13.2–17.1)
Lymphs Abs: 1565 cells/uL (ref 850–3900)
MCH: 32.9 pg (ref 27.0–33.0)
MCHC: 34.6 g/dL (ref 32.0–36.0)
MCV: 95.1 fL (ref 80.0–100.0)
MPV: 9.8 fL (ref 7.5–12.5)
Monocytes Relative: 8.7 %
Neutro Abs: 2920 cells/uL (ref 1500–7800)
Neutrophils Relative %: 58.4 %
Platelets: 236 10*3/uL (ref 140–400)
RBC: 4.5 10*6/uL (ref 4.20–5.80)
RDW: 12.7 % (ref 11.0–15.0)
Total Lymphocyte: 31.3 %
WBC: 5 10*3/uL (ref 3.8–10.8)

## 2019-12-21 LAB — RPR: RPR Ser Ql: NONREACTIVE

## 2019-12-21 LAB — HIV-1 RNA QUANT-NO REFLEX-BLD
HIV 1 RNA Quant: <20 copies/mL
HIV-1 RNA Quant, Log: <1.3 Log copies/mL

## 2019-12-31 ENCOUNTER — Ambulatory Visit: Payer: BLUE CROSS/BLUE SHIELD | Admitting: Internal Medicine

## 2020-01-02 ENCOUNTER — Encounter: Payer: Self-pay | Admitting: Internal Medicine

## 2020-01-02 ENCOUNTER — Other Ambulatory Visit: Payer: Self-pay

## 2020-01-02 ENCOUNTER — Ambulatory Visit (INDEPENDENT_AMBULATORY_CARE_PROVIDER_SITE_OTHER): Payer: BC Managed Care – PPO | Admitting: Internal Medicine

## 2020-01-02 DIAGNOSIS — B20 Human immunodeficiency virus [HIV] disease: Secondary | ICD-10-CM

## 2020-01-02 DIAGNOSIS — N182 Chronic kidney disease, stage 2 (mild): Secondary | ICD-10-CM

## 2020-01-02 DIAGNOSIS — Z79899 Other long term (current) drug therapy: Secondary | ICD-10-CM

## 2020-01-02 NOTE — Progress Notes (Signed)
   RFV: follow up with hiv disease- televisit-by phone  Patient ID: Jerome Robbins, male   DOB: 08/10/1976, 44 y.o.   MRN: [id number]  HPI 44yo M with hiv disease, cd4 count of 723/vk <20, on genvoya. Still working from home. Daughters staying home for schooling. 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 He has not traveled in over a year for his work. Has been observant of 3Ws. Overall doing well.   Outpatient Encounter Medications as of 01/02/2020  Medication Sig  . GENVOYA 150-150-200-10 MG TABS tablet TAKE 1 TABLET BY MOUTH  DAILY WITH BREAKFAST   No facility-administered encounter medications on file as of 01/02/2020.     Patient Active Problem List   Diagnosis Date Noted  . HIV disease (HCC) 07/10/2014  . Exposure to syphilis 07/10/2014     There are no preventive care reminders to display for this patient.   Review of Systems  Constitutional: Negative for fever, chills, diaphoresis, activity change, appetite change, fatigue and unexpected weight change.  HENT: Negative for congestion, sore throat, rhinorrhea, sneezing, trouble swallowing and sinus pressure.  Eyes: Negative for photophobia and visual disturbance.  Respiratory: Negative for cough, chest tightness, shortness of breath, wheezing and stridor.  Cardiovascular: Negative for chest pain, palpitations and leg swelling.  Gastrointestinal: Negative for nausea, vomiting, abdominal pain, diarrhea, constipation, blood in stool, abdominal distention and anal bleeding.  Genitourinary: Negative for dysuria, hematuria, flank pain and difficulty urinating.  Musculoskeletal: Negative for myalgias, back pain, joint swelling, arthralgias and gait problem.  Skin: Negative for color change, pallor, rash and wound.  Neurological: Negative for dizziness, tremors, weakness and light-headedness.  Hematological: Negative for adenopathy. Does not bruise/bleed easily.  Psychiatric/Behavioral: Negative for behavioral problems, confusion,  sleep disturbance, dysphoric mood, decreased concentration and agitation.    Physical Exam   No physical exam   Lab Results  Component Value Date   CD4TCELL 49 12/17/2019   Lab Results  Component Value Date   CD4TABS 723 12/17/2019   CD4TABS 740 10/25/2018   CD4TABS 880 04/25/2018   Lab Results  Component Value Date   HIV1RNAQUANT <20 NOT DETECTED 12/17/2019   Lab Results  Component Value Date   HEPBSAB POS (A) 06/05/2014   Lab Results  Component Value Date   LABRPR NON-REACTIVE 12/17/2019    CBC Lab Results  Component Value Date   WBC 5.0 12/17/2019   RBC 4.50 12/17/2019   HGB 14.8 12/17/2019   HCT 42.8 12/17/2019   PLT 236 12/17/2019   MCV 95.1 12/17/2019   MCH 32.9 12/17/2019   MCHC 34.6 12/17/2019   RDW 12.7 12/17/2019   LYMPHSABS 1,565 12/17/2019   MONOABS 376 11/29/2016   EOSABS 60 12/17/2019    BMET Lab Results  Component Value Date   NA 136 12/17/2019   K 4.9 12/17/2019   CL 100 12/17/2019   CO2 29 12/17/2019   GLUCOSE 87 12/17/2019   BUN 25 12/17/2019   CREATININE 1.41 (H) 12/17/2019   CALCIUM 9.9 12/17/2019   GFRNONAA 60 12/17/2019   GFRAA 69 12/17/2019      Assessment and Plan  hiv disease = well controlled.  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

## 2020-03-05 DIAGNOSIS — B20 Human immunodeficiency virus [HIV] disease: Secondary | ICD-10-CM

## 2020-03-05 MED ORDER — GENVOYA 150-150-200-10 MG PO TABS: 1.0000 | ORAL_TABLET | Freq: Every day | ORAL | 5 refills | Status: DC

## 2020-07-16 ENCOUNTER — Other Ambulatory Visit: Payer: Self-pay | Admitting: Internal Medicine

## 2020-07-16 DIAGNOSIS — B20 Human immunodeficiency virus [HIV] disease: Secondary | ICD-10-CM

## 2020-07-17 ENCOUNTER — Telehealth: Payer: Self-pay | Admitting: *Deleted

## 2020-07-17 NOTE — Telephone Encounter (Signed)
Received refill request for Genvoya, requested 1 year supply. Patient overdue for visit. RN sent 90 day supply, called patient and left voicemail asking him to please call to schedule an appointment for follow up. Andree Coss, RN

## 2020-08-22 ENCOUNTER — Other Ambulatory Visit: Payer: Self-pay

## 2020-08-22 DIAGNOSIS — Z79899 Other long term (current) drug therapy: Secondary | ICD-10-CM

## 2020-08-22 DIAGNOSIS — B20 Human immunodeficiency virus [HIV] disease: Secondary | ICD-10-CM

## 2020-08-25 ENCOUNTER — Other Ambulatory Visit: Payer: BC Managed Care – PPO

## 2020-08-25 ENCOUNTER — Other Ambulatory Visit: Payer: Self-pay

## 2020-08-25 DIAGNOSIS — Z79899 Other long term (current) drug therapy: Secondary | ICD-10-CM

## 2020-08-25 DIAGNOSIS — B20 Human immunodeficiency virus [HIV] disease: Secondary | ICD-10-CM

## 2020-08-26 LAB — T-HELPER CELL (CD4) - (RCID CLINIC ONLY)
CD4 % Helper T Cell: 49 % (ref 33–65)
CD4 T Cell Abs: 754 /uL (ref 400–1790)

## 2020-08-27 LAB — COMPLETE METABOLIC PANEL WITH GFR
AG Ratio: 1.7 (calc) (ref 1.0–2.5)
ALT: 18 U/L (ref 9–46)
AST: 20 U/L (ref 10–40)
Albumin: 4.5 g/dL (ref 3.6–5.1)
Alkaline phosphatase (APISO): 45 U/L (ref 36–130)
BUN: 20 mg/dL (ref 7–25)
CO2: 26 mmol/L (ref 20–32)
Calcium: 9.5 mg/dL (ref 8.6–10.3)
Chloride: 102 mmol/L (ref 98–110)
Creat: 1.24 mg/dL (ref 0.60–1.35)
GFR, Est African American: 80 mL/min/{1.73_m2} (ref >=60)
GFR, Est Non African American: 69 mL/min/{1.73_m2} (ref >=60)
Globulin: 2.7 g/dL (calc) (ref 1.9–3.7)
Glucose, Bld: 89 mg/dL (ref 65–99)
Potassium: 4.3 mmol/L (ref 3.5–5.3)
Sodium: 137 mmol/L (ref 135–146)
Total Bilirubin: 0.5 mg/dL (ref 0.2–1.2)
Total Protein: 7.2 g/dL (ref 6.1–8.1)

## 2020-08-27 LAB — CBC WITH DIFFERENTIAL/PLATELET
Absolute Monocytes: 308 cells/uL (ref 200–950)
Basophils Absolute: 30 cells/uL (ref 0–200)
Basophils Relative: 0.8 %
Eosinophils Absolute: 80 cells/uL (ref 15–500)
Eosinophils Relative: 2.1 %
HCT: 42.2 % (ref 38.5–50.0)
Hemoglobin: 14.5 g/dL (ref 13.2–17.1)
Lymphs Abs: 1615 cells/uL (ref 850–3900)
MCH: 32.7 pg (ref 27.0–33.0)
MCHC: 34.4 g/dL (ref 32.0–36.0)
MCV: 95.3 fL (ref 80.0–100.0)
MPV: 10 fL (ref 7.5–12.5)
Monocytes Relative: 8.1 %
Neutro Abs: 1767 cells/uL (ref 1500–7800)
Neutrophils Relative %: 46.5 %
Platelets: 248 10*3/uL (ref 140–400)
RBC: 4.43 10*6/uL (ref 4.20–5.80)
RDW: 12.5 % (ref 11.0–15.0)
Total Lymphocyte: 42.5 %
WBC: 3.8 10*3/uL (ref 3.8–10.8)

## 2020-08-27 LAB — LIPID PANEL
Cholesterol: 185 mg/dL (ref <200)
HDL: 68 mg/dL (ref >=40)
LDL Cholesterol (Calc): 101 mg/dL (calc) — ABNORMAL HIGH
Non-HDL Cholesterol (Calc): 117 mg/dL (calc) (ref <130)
Total CHOL/HDL Ratio: 2.7 (calc) (ref <5.0)
Triglycerides: 73 mg/dL (ref <150)

## 2020-08-27 LAB — HIV-1 RNA QUANT-NO REFLEX-BLD
HIV 1 RNA Quant: <20 Copies/mL
HIV-1 RNA Quant, Log: <1.3 Log cps/mL

## 2020-09-08 ENCOUNTER — Encounter: Payer: BC Managed Care – PPO | Admitting: Internal Medicine

## 2020-09-17 ENCOUNTER — Ambulatory Visit: Payer: BC Managed Care – PPO | Admitting: Internal Medicine

## 2020-09-17 ENCOUNTER — Other Ambulatory Visit: Payer: Self-pay

## 2020-09-17 DIAGNOSIS — Z23 Encounter for immunization: Secondary | ICD-10-CM

## 2020-09-17 DIAGNOSIS — Z79899 Other long term (current) drug therapy: Secondary | ICD-10-CM

## 2020-09-17 DIAGNOSIS — B20 Human immunodeficiency virus [HIV] disease: Secondary | ICD-10-CM

## 2020-09-17 NOTE — Progress Notes (Signed)
RFV: follow up for hiv disease  Patient ID: Jerome Robbins, male   DOB: 29-Apr-1976, 44 y.o.   MRN: [id number]  HPI Jerome Robbins is a 44yo M with well controlled hiv disease, He states that he received his covid vaccine but also his work, [employer] - mandated vaccine even remote workers. Resume travel in jan 2022 he believes. He reports he had close contact a few weeks back when one of His girls had mild case (headache on Sunday- Thursday--gymnastic) - his youngest daughter gave it to her partner and his ex girlfriend.   Also reports that he and his partner will be Getting married on oct 29th  Outpatient Encounter Medications as of 09/17/2020  Medication Sig  . GENVOYA 150-150-200-10 MG TABS tablet TAKE 1 TABLET BY MOUTH  DAILY WITH BREAKFAST   No facility-administered encounter medications on file as of 09/17/2020.     Patient Active Problem List   Diagnosis Date Noted  . HIV disease (HCC) 07/10/2014  . Exposure to syphilis 07/10/2014     Health Maintenance Due  Topic Date Due  . INFLUENZA VACCINE  07/13/2020     Review of Systems  Constitutional: Negative for fever, chills, diaphoresis, activity change, appetite change, fatigue and unexpected weight change.  HENT: Negative for congestion, sore throat, rhinorrhea, sneezing, trouble swallowing and sinus pressure.  Eyes: Negative for photophobia and visual disturbance.  Respiratory: Negative for cough, chest tightness, shortness of breath, wheezing and stridor.  Cardiovascular: Negative for chest pain, palpitations and leg swelling.  Gastrointestinal: Negative for nausea, vomiting, abdominal pain, diarrhea, constipation, blood in stool, abdominal distention and anal bleeding.  Genitourinary: Negative for dysuria, hematuria, flank pain and difficulty urinating.  Musculoskeletal: Negative for myalgias, back pain, joint swelling, arthralgias and gait problem.  Skin: Negative for color change, pallor, rash and wound.  Neurological: Negative  for dizziness, tremors, weakness and light-headedness.  Hematological: Negative for adenopathy. Does not bruise/bleed easily.  Psychiatric/Behavioral: Negative for behavioral problems, confusion, sleep disturbance, dysphoric mood, decreased concentration and agitation.    Physical Exam  There were no vitals taken for this visit. Physical Exam  Constitutional: He is oriented to person, place, and time. He appears well-developed and well-nourished. No distress.  HENT:  Mouth/Throat: Oropharynx is clear and moist. No oropharyngeal exudate.  Cardiovascular: Normal rate, regular rhythm and normal heart sounds. Exam reveals no gallop and no friction rub.  No murmur heard.  Pulmonary/Chest: Effort normal and breath sounds normal. No respiratory distress. He has no wheezes.  Lymphadenopathy:  He has no cervical adenopathy.  Neurological: He is alert and oriented to person, place, and time.  Skin: Skin is warm and dry. No rash noted. No erythema.  Psychiatric: He has a normal mood and affect. His behavior is normal.     Lab Results  Component Value Date   CD4TCELL 49 08/25/2020   Lab Results  Component Value Date   CD4TABS 754 08/25/2020   CD4TABS 723 12/17/2019   CD4TABS 740 10/25/2018   Lab Results  Component Value Date   HIV1RNAQUANT <20 08/25/2020   Lab Results  Component Value Date   HEPBSAB POS (A) 06/05/2014   Lab Results  Component Value Date   LABRPR NON-REACTIVE 12/17/2019    CBC Lab Results  Component Value Date   WBC 3.8 08/25/2020   RBC 4.43 08/25/2020   HGB 14.5 08/25/2020   HCT 42.2 08/25/2020   PLT 248 08/25/2020   MCV 95.3 08/25/2020   MCH 32.7 08/25/2020   MCHC 34.4 08/25/2020  RDW 12.5 08/25/2020   LYMPHSABS 1,615 08/25/2020   MONOABS 376 11/29/2016   EOSABS 80 08/25/2020    BMET Lab Results  Component Value Date   NA 137 08/25/2020   K 4.3 08/25/2020   CL 102 08/25/2020   CO2 26 08/25/2020   GLUCOSE 89 08/25/2020   BUN 20 08/25/2020    CREATININE 1.24 08/25/2020   CALCIUM 9.5 08/25/2020   GFRNONAA 69 08/25/2020   GFRAA 80 08/25/2020      Assessment and Plan hiv disease = well controlled based on recent labs  Long term medication management = cr at baseline. Elevated slightly due to muscle mass  Health maintenance =  Will give flu and pneumovax today  moderna booster due next month   Will see back in 6 months.

## 2020-10-09 ENCOUNTER — Other Ambulatory Visit: Payer: Self-pay | Admitting: Internal Medicine

## 2020-10-09 DIAGNOSIS — B20 Human immunodeficiency virus [HIV] disease: Secondary | ICD-10-CM

## 2020-11-27 ENCOUNTER — Emergency Department (HOSPITAL_COMMUNITY): Payer: BC Managed Care – PPO

## 2020-11-27 ENCOUNTER — Encounter (HOSPITAL_COMMUNITY): Payer: Self-pay

## 2020-11-27 ENCOUNTER — Other Ambulatory Visit: Payer: Self-pay

## 2020-11-27 ENCOUNTER — Emergency Department (HOSPITAL_COMMUNITY)
Admission: EM | Admit: 2020-11-27 | Discharge: 2020-11-27 | Disposition: A | Discharge status: Home / Self Care | Payer: BC Managed Care – PPO | Attending: Emergency Medicine | Admitting: Emergency Medicine

## 2020-11-27 DIAGNOSIS — R0789 Other chest pain: Secondary | ICD-10-CM | POA: Diagnosis present

## 2020-11-27 DIAGNOSIS — Z21 Asymptomatic human immunodeficiency virus [HIV] infection status: Secondary | ICD-10-CM | POA: Diagnosis not present

## 2020-11-27 DIAGNOSIS — R079 Chest pain, unspecified: Secondary | ICD-10-CM

## 2020-11-27 LAB — CBC
HCT: 42.7 % (ref 39.0–52.0)
Hemoglobin: 14.5 g/dL (ref 13.0–17.0)
MCH: 32.4 pg (ref 26.0–34.0)
MCHC: 34 g/dL (ref 30.0–36.0)
MCV: 95.3 fL (ref 80.0–100.0)
Platelets: 299 10*3/uL (ref 150–400)
RBC: 4.48 MIL/uL (ref 4.22–5.81)
RDW: 11.8 % (ref 11.5–15.5)
WBC: 4.6 10*3/uL (ref 4.0–10.5)
nRBC: 0 % (ref 0.0–0.2)

## 2020-11-27 LAB — BASIC METABOLIC PANEL
Anion gap: 11 (ref 5–15)
BUN: 14 mg/dL (ref 6–20)
CO2: 27 mmol/L (ref 22–32)
Calcium: 9.6 mg/dL (ref 8.9–10.3)
Chloride: 101 mmol/L (ref 98–111)
Creatinine, Ser: 1.29 mg/dL — ABNORMAL HIGH (ref 0.61–1.24)
GFR, Estimated: >60 mL/min (ref >60)
Glucose, Bld: 100 mg/dL — ABNORMAL HIGH (ref 70–99)
Potassium: 4.5 mmol/L (ref 3.5–5.1)
Sodium: 139 mmol/L (ref 135–145)

## 2020-11-27 LAB — TROPONIN I (HIGH SENSITIVITY)
Troponin I (High Sensitivity): 8 ng/L (ref <18)
Troponin I (High Sensitivity): 7 ng/L (ref <18)

## 2020-11-27 NOTE — ED Provider Notes (Signed)
[facility] EMERGENCY DEPARTMENT Provider Note   CSN: [id number] Arrival date & time: 11/27/20  1011     History Chief Complaint  Patient presents with  . Chest Pain    Jerome Robbins is a 44 y.o. male.  He has a history of HIV.  He is complaining of some left-sided chest pressure that began yesterday.  Worse with palpation and movement of chest.  Not associated with diaphoresis shortness of breath dizziness lightheadedness nausea.  No known trauma.  No prior cardiac disease.  Denies any cocaine.  The history is provided by the patient.  Chest Pain Pain location:  L chest Pain quality: pressure   Pain radiates to:  Does not radiate Pain severity:  Moderate Onset quality:  Gradual Duration:  2 days Timing:  Constant Progression:  Unchanged Chronicity:  New Relieved by:  None tried Worsened by:  Coughing and movement Ineffective treatments:  None tried Associated symptoms: no abdominal pain, no back pain, no claudication, no cough, no diaphoresis, no dizziness, no fever, no nausea, no shortness of breath and no vomiting   Risk factors: no coronary artery disease, no diabetes mellitus, no prior DVT/PE and no smoking     HPI: A 44 year old patient presents for evaluation of chest pain. Initial onset of pain was more than 6 hours ago. The patient's chest pain is described as heaviness/pressure/tightness and is not worse with exertion. The patient's chest pain is middle- or left-sided, is not well-localized, is not sharp and does not radiate to the arms/jaw/neck. The patient does not complain of nausea and denies diaphoresis. The patient has no history of stroke, has no history of peripheral artery disease, has not smoked in the past 90 days, denies any history of treated diabetes, has no relevant family history of coronary artery disease (first degree relative at less than age 89), is not hypertensive, has no history of hypercholesterolemia and does not have an elevated  BMI (>=30).   History reviewed. No pertinent past medical history.  Patient Active Problem List   Diagnosis Date Noted  . HIV disease (HCC) 07/10/2014  . Exposure to syphilis 07/10/2014    Past Surgical History:  Procedure Laterality Date  . KNEE ARTHROSCOPY Left 2007       Family History  Problem Relation Age of Onset  . Diabetes Father   . Hypertension Father   . Cancer Paternal Grandmother     Social History   Tobacco Use  . Smoking status: Never Smoker  . Smokeless tobacco: Never Used  Substance Use Topics  . Alcohol use: Yes    Alcohol/week: 2.0 standard drinks    Types: 2 Glasses of wine per week  . Drug use: No    Home Medications Prior to Admission medications   Medication Sig Start Date End Date Taking? Authorizing Provider  GENVOYA 150-150-200-10 MG TABS tablet TAKE 1 TABLET BY MOUTH  DAILY WITH BREAKFAST 10/10/20   Judyann Munson, MD    Allergies    Triumeq [abacavir-dolutegravir-lamivud]  Review of Systems   Review of Systems  Constitutional: Negative for diaphoresis and fever.  HENT: Negative for sore throat.   Eyes: Negative for visual disturbance.  Respiratory: Negative for cough and shortness of breath.   Cardiovascular: Positive for chest pain. Negative for claudication.  Gastrointestinal: Negative for abdominal pain, nausea and vomiting.  Genitourinary: Negative for dysuria.  Musculoskeletal: Negative for back pain.  Skin: Negative for rash.  Neurological: Negative for dizziness.    Physical Exam Updated Vital Signs  BP 135/89 (BP Location: Right Arm)   Pulse (!) 56   Temp 98.3 F (36.8 C) (Oral)   Resp 18   Ht 5\' 7"  (1.702 m)   Wt 86.2 kg   SpO2 100%   BMI 29.76 kg/m   Physical Exam Vitals and nursing note reviewed.  Constitutional:      Appearance: He is well-developed and well-nourished.  HENT:     Head: Normocephalic and atraumatic.  Eyes:     Conjunctiva/sclera: Conjunctivae normal.  Cardiovascular:     Rate and  Rhythm: Normal rate and regular rhythm.     Heart sounds: No murmur heard.   Pulmonary:     Effort: Pulmonary effort is normal. No respiratory distress.     Breath sounds: Normal breath sounds.  Chest:     Chest wall: Tenderness (reproduces his pain) present.  Abdominal:     Palpations: Abdomen is soft.     Tenderness: There is no abdominal tenderness.  Musculoskeletal:        General: No edema. Normal range of motion.     Cervical back: Neck supple.     Right lower leg: No tenderness. No edema.     Left lower leg: No tenderness. No edema.  Skin:    General: Skin is warm and dry.     Capillary Refill: Capillary refill takes less than 2 seconds.  Neurological:     General: No focal deficit present.     Mental Status: He is alert.  Psychiatric:        Mood and Affect: Mood and affect normal.     ED Results / Procedures / Treatments   Labs (all labs ordered are listed, but only abnormal results are displayed) Labs Reviewed  BASIC METABOLIC PANEL - Abnormal; Notable for the following components:      Result Value   Glucose, Bld 100 (*)    Creatinine, Ser 1.29 (*)    All other components within normal limits  CBC  TROPONIN I (HIGH SENSITIVITY)  TROPONIN I (HIGH SENSITIVITY)    EKG EKG Interpretation  Date/Time:  Thursday November 27 2020 10:12:45 EST Ventricular Rate:  57 PR Interval:  170 QRS Duration: 92 QT Interval:  392 QTC Calculation: 381 R Axis:   69 Text Interpretation: Sinus bradycardia Minimal voltage criteria for LVH, may be normal variant ( Sokolow-Lyon ) Borderline ECG No significant change since prior 10/19 Confirmed by 11/19 [phone]) on 11/27/2020 4:32:53 PM   Radiology DG Chest 2 View  Result Date: 11/27/2020 CLINICAL DATA:  Chest pain EXAM: CHEST - 2 VIEW COMPARISON:  October 01, 2018 FINDINGS: Lungs are clear. Heart size and pulmonary vascularity are normal. No adenopathy. No pneumothorax. No bone lesions. IMPRESSION: Lungs clear.   Cardiac silhouette normal. Electronically Signed   By: October 03, 2018 III M.D.   On: 11/27/2020 10:38    Procedures Procedures (including critical care time)  Medications Ordered in ED Medications - No data to display  ED Course  I have reviewed the triage vital signs and the nursing notes.  Pertinent labs & imaging results that were available during my care of the patient were reviewed by me and considered in my medical decision making (see chart for details).    MDM Rules/Calculators/A&P HEAR Score: 3                        This patient complains of left-sided chest pain; this involves an extensive number of treatment Options and  is a complaint that carries with it a high risk of complications and Morbidity. The differential includes ACS, pneumonia, PE, reflux, musculoskeletal, vascular  I ordered, reviewed and interpreted labs, which included CBC with normal white count normal hemoglobin, chemistries normal other than mildly elevated creatinine, 2 troponins flat  I ordered imaging studies which included chest x-ray and I independently    visualized and interpreted imaging which showed no acute findings Previous records obtained and reviewed in epic, no recent cardiac complaints Patient is PERC negative  After the interventions stated above, I reevaluated the patient and found patient to be minimally symptomatic.  Remains with stable vital signs satting 100% on room air.  No evidence of cardiac injury or other serious findings.  Patient is comfortable plan for outpatient follow-up with his primary care doctor.  Return instructions discussed.  Final Clinical Impression(s) / ED Diagnoses Final diagnoses:  Nonspecific chest pain    Rx / DC Orders ED Discharge Orders    None       Terrilee Files, MD 11/28/20 1103

## 2020-11-27 NOTE — ED Triage Notes (Signed)
Patient complains of anterior cp x 1 day. Reports that the pressure is worse with inspiration and cough. Patient denies radiation

## 2020-11-27 NOTE — Discharge Instructions (Addendum)
You were seen in the emergency department for left-sided chest pressure.  You had blood work EKG and a chest x-ray that did not show any serious findings.  This may be muscular and you should try some ibuprofen and warm compress.  Return to the emergency department if any worsening or concerning symptoms.

## 2021-02-25 ENCOUNTER — Other Ambulatory Visit: Payer: Self-pay

## 2021-02-25 DIAGNOSIS — Z113 Encounter for screening for infections with a predominantly sexual mode of transmission: Secondary | ICD-10-CM

## 2021-02-25 DIAGNOSIS — Z79899 Other long term (current) drug therapy: Secondary | ICD-10-CM

## 2021-02-25 DIAGNOSIS — B20 Human immunodeficiency virus [HIV] disease: Secondary | ICD-10-CM

## 2021-03-02 ENCOUNTER — Other Ambulatory Visit: Payer: Self-pay

## 2021-03-02 ENCOUNTER — Other Ambulatory Visit (HOSPITAL_COMMUNITY)
Admission: RE | Admit: 2021-03-02 | Discharge: 2021-03-02 | Disposition: A | Discharge status: Home / Self Care | Payer: BC Managed Care – PPO | Source: Ambulatory Visit | Attending: Internal Medicine | Admitting: Internal Medicine

## 2021-03-02 ENCOUNTER — Other Ambulatory Visit: Payer: BC Managed Care – PPO

## 2021-03-02 DIAGNOSIS — Z79899 Other long term (current) drug therapy: Secondary | ICD-10-CM

## 2021-03-02 DIAGNOSIS — B20 Human immunodeficiency virus [HIV] disease: Secondary | ICD-10-CM

## 2021-03-02 DIAGNOSIS — Z113 Encounter for screening for infections with a predominantly sexual mode of transmission: Secondary | ICD-10-CM | POA: Insufficient documentation

## 2021-03-03 LAB — URINE CYTOLOGY ANCILLARY ONLY
Chlamydia: NEGATIVE
Comment: NEGATIVE
Comment: NORMAL
Neisseria Gonorrhea: NEGATIVE

## 2021-03-03 LAB — T-HELPER CELL (CD4) - (RCID CLINIC ONLY)
CD4 % Helper T Cell: 54 % (ref 33–65)
CD4 T Cell Abs: 916 /uL (ref 400–1790)

## 2021-03-04 LAB — HIV-1 RNA QUANT-NO REFLEX-BLD
HIV 1 RNA Quant: NOT DETECTED Copies/mL
HIV-1 RNA Quant, Log: NOT DETECTED Log cps/mL

## 2021-03-04 LAB — COMPLETE METABOLIC PANEL WITH GFR
AG Ratio: 1.5 (calc) (ref 1.0–2.5)
ALT: 57 U/L — ABNORMAL HIGH (ref 9–46)
AST: 35 U/L (ref 10–40)
Albumin: 4.3 g/dL (ref 3.6–5.1)
Alkaline phosphatase (APISO): 52 U/L (ref 36–130)
BUN: 14 mg/dL (ref 7–25)
CO2: 26 mmol/L (ref 20–32)
Calcium: 9.6 mg/dL (ref 8.6–10.3)
Chloride: 104 mmol/L (ref 98–110)
Creat: 1.3 mg/dL (ref 0.60–1.35)
GFR, Est African American: 76 mL/min/{1.73_m2} (ref >=60)
GFR, Est Non African American: 65 mL/min/{1.73_m2} (ref >=60)
Globulin: 2.9 g/dL (calc) (ref 1.9–3.7)
Glucose, Bld: 98 mg/dL (ref 65–99)
Potassium: 4.6 mmol/L (ref 3.5–5.3)
Sodium: 141 mmol/L (ref 135–146)
Total Bilirubin: 0.5 mg/dL (ref 0.2–1.2)
Total Protein: 7.2 g/dL (ref 6.1–8.1)

## 2021-03-04 LAB — CBC WITH DIFFERENTIAL/PLATELET
Absolute Monocytes: 368 cells/uL (ref 200–950)
Basophils Absolute: 28 cells/uL (ref 0–200)
Basophils Relative: 0.7 %
Eosinophils Absolute: 60 cells/uL (ref 15–500)
Eosinophils Relative: 1.5 %
HCT: 41.2 % (ref 38.5–50.0)
Hemoglobin: 14 g/dL (ref 13.2–17.1)
Lymphs Abs: 1712 cells/uL (ref 850–3900)
MCH: 32.2 pg (ref 27.0–33.0)
MCHC: 34 g/dL (ref 32.0–36.0)
MCV: 94.7 fL (ref 80.0–100.0)
MPV: 9.7 fL (ref 7.5–12.5)
Monocytes Relative: 9.2 %
Neutro Abs: 1832 cells/uL (ref 1500–7800)
Neutrophils Relative %: 45.8 %
Platelets: 241 10*3/uL (ref 140–400)
RBC: 4.35 10*6/uL (ref 4.20–5.80)
RDW: 12.7 % (ref 11.0–15.0)
Total Lymphocyte: 42.8 %
WBC: 4 10*3/uL (ref 3.8–10.8)

## 2021-03-04 LAB — RPR: RPR Ser Ql: NONREACTIVE

## 2021-03-16 ENCOUNTER — Ambulatory Visit (INDEPENDENT_AMBULATORY_CARE_PROVIDER_SITE_OTHER): Payer: BC Managed Care – PPO | Admitting: Internal Medicine

## 2021-03-16 ENCOUNTER — Encounter: Payer: Self-pay | Admitting: Internal Medicine

## 2021-03-16 ENCOUNTER — Other Ambulatory Visit: Payer: Self-pay

## 2021-03-16 VITALS — BP 131/74 | HR 52 | Temp 98.0°F | Ht 68.0 in | Wt 201.8 lb

## 2021-03-16 DIAGNOSIS — B20 Human immunodeficiency virus [HIV] disease: Secondary | ICD-10-CM | POA: Diagnosis not present

## 2021-03-16 DIAGNOSIS — R001 Bradycardia, unspecified: Secondary | ICD-10-CM

## 2021-03-16 DIAGNOSIS — Z79899 Other long term (current) drug therapy: Secondary | ICD-10-CM | POA: Diagnosis not present

## 2021-03-16 DIAGNOSIS — N182 Chronic kidney disease, stage 2 (mild): Secondary | ICD-10-CM

## 2021-03-16 NOTE — Progress Notes (Signed)
RFV: follow up for hiv disease  Patient ID: Casandra Doffing, male   DOB: 14-Sep-1976, 45 y.o.   MRN: [id number]  HPI  45yo M with HIV disease, CD 4 count 916/VL<20, on genvoya. Doing well with adherence. In terms of other activities, he continues to  working out; job going well. Applying to a new role within [employer]. No recent health concerns.  Has some family stressors - ex wife moving to [place]. His mother moving in with him.  Ros: weight gain.  Colonoscopy on dec 2021- clear. Next one is 10 yrs.  Social History   Tobacco Use  . Smoking status: Never Smoker  . Smokeless tobacco: Never Used  Substance Use Topics  . Alcohol use: Yes    Alcohol/week: 2.0 standard drinks    Types: 2 Glasses of wine per week  . Drug use: No    Outpatient Encounter Medications as of 03/16/2021  Medication Sig  . GENVOYA 150-150-200-10 MG TABS tablet TAKE 1 TABLET BY MOUTH  DAILY WITH BREAKFAST   No facility-administered encounter medications on file as of 03/16/2021.     Patient Active Problem List   Diagnosis Date Noted  . HIV disease (HCC) 07/10/2014  . Exposure to syphilis 07/10/2014     Health Maintenance Due  Topic Date Due  . COLONOSCOPY (Pts 45-90yrs Insurance coverage will need to be confirmed)  Never done     Review of Systems + weight gain. 12 point ros is negative.  Physical Exam   BP 131/74   Pulse (!) 52   Temp 98 F (36.7 C)   Ht 5\' 8"  (1.727 m)   Wt 201 lb 12.8 oz (91.5 kg)   BMI 30.68 kg/m   Physical Exam  Constitutional: He is oriented to person, place, and time. He appears well-developed and well-nourished. No distress.  HENT:  Mouth/Throat: Oropharynx is clear and moist. No oropharyngeal exudate.  Lymphadenopathy:  He has no cervical adenopathy.  Neurological: He is alert and oriented to person, place, and time.  Skin: Skin is warm and dry. No rash noted. No erythema.  Psychiatric: He has a normal mood and affect. His behavior is normal.    Lab Results   Component Value Date   CD4TCELL 54 03/02/2021   Lab Results  Component Value Date   CD4TABS 916 03/02/2021   CD4TABS 754 08/25/2020   CD4TABS 723 12/17/2019   Lab Results  Component Value Date   HIV1RNAQUANT Not Detected 03/02/2021   Lab Results  Component Value Date   HEPBSAB POS (A) 06/05/2014   Lab Results  Component Value Date   LABRPR NON-REACTIVE 03/02/2021    CBC Lab Results  Component Value Date   WBC 4.0 03/02/2021   RBC 4.35 03/02/2021   HGB 14.0 03/02/2021   HCT 41.2 03/02/2021   PLT 241 03/02/2021   MCV 94.7 03/02/2021   MCH 32.2 03/02/2021   MCHC 34.0 03/02/2021   RDW 12.7 03/02/2021   LYMPHSABS 1,712 03/02/2021   MONOABS 376 11/29/2016   EOSABS 60 03/02/2021    BMET Lab Results  Component Value Date   NA 141 03/02/2021   K 4.6 03/02/2021   CL 104 03/02/2021   CO2 26 03/02/2021   GLUCOSE 98 03/02/2021   BUN 14 03/02/2021   CREATININE 1.30 03/02/2021   CALCIUM 9.6 03/02/2021   GFRNONAA 65 03/02/2021   GFRAA 76 03/02/2021    Assessment and Plan  HIV disease = continues to be well controlled. Will plan to continue with genvoya  Bradycardia = his exercises regularly. Asymptomatic. No need for further work up at this time  Long term medication management = cr is stable  ckd 2 = elevated from increased muscle mass  Health maintenance = uptodate on covid vaccine and colonoscopy

## 2021-03-25 ENCOUNTER — Other Ambulatory Visit: Payer: Self-pay | Admitting: Internal Medicine

## 2021-03-25 DIAGNOSIS — B20 Human immunodeficiency virus [HIV] disease: Secondary | ICD-10-CM

## 2021-09-01 IMAGING — DX DG CHEST 2V
2 series · 2 of 2 positions shown · non-contrast
Comparison: October 01, 2018

CLINICAL DATA: Chest pain

EXAM:
CHEST - 2 VIEW

[chest pa]
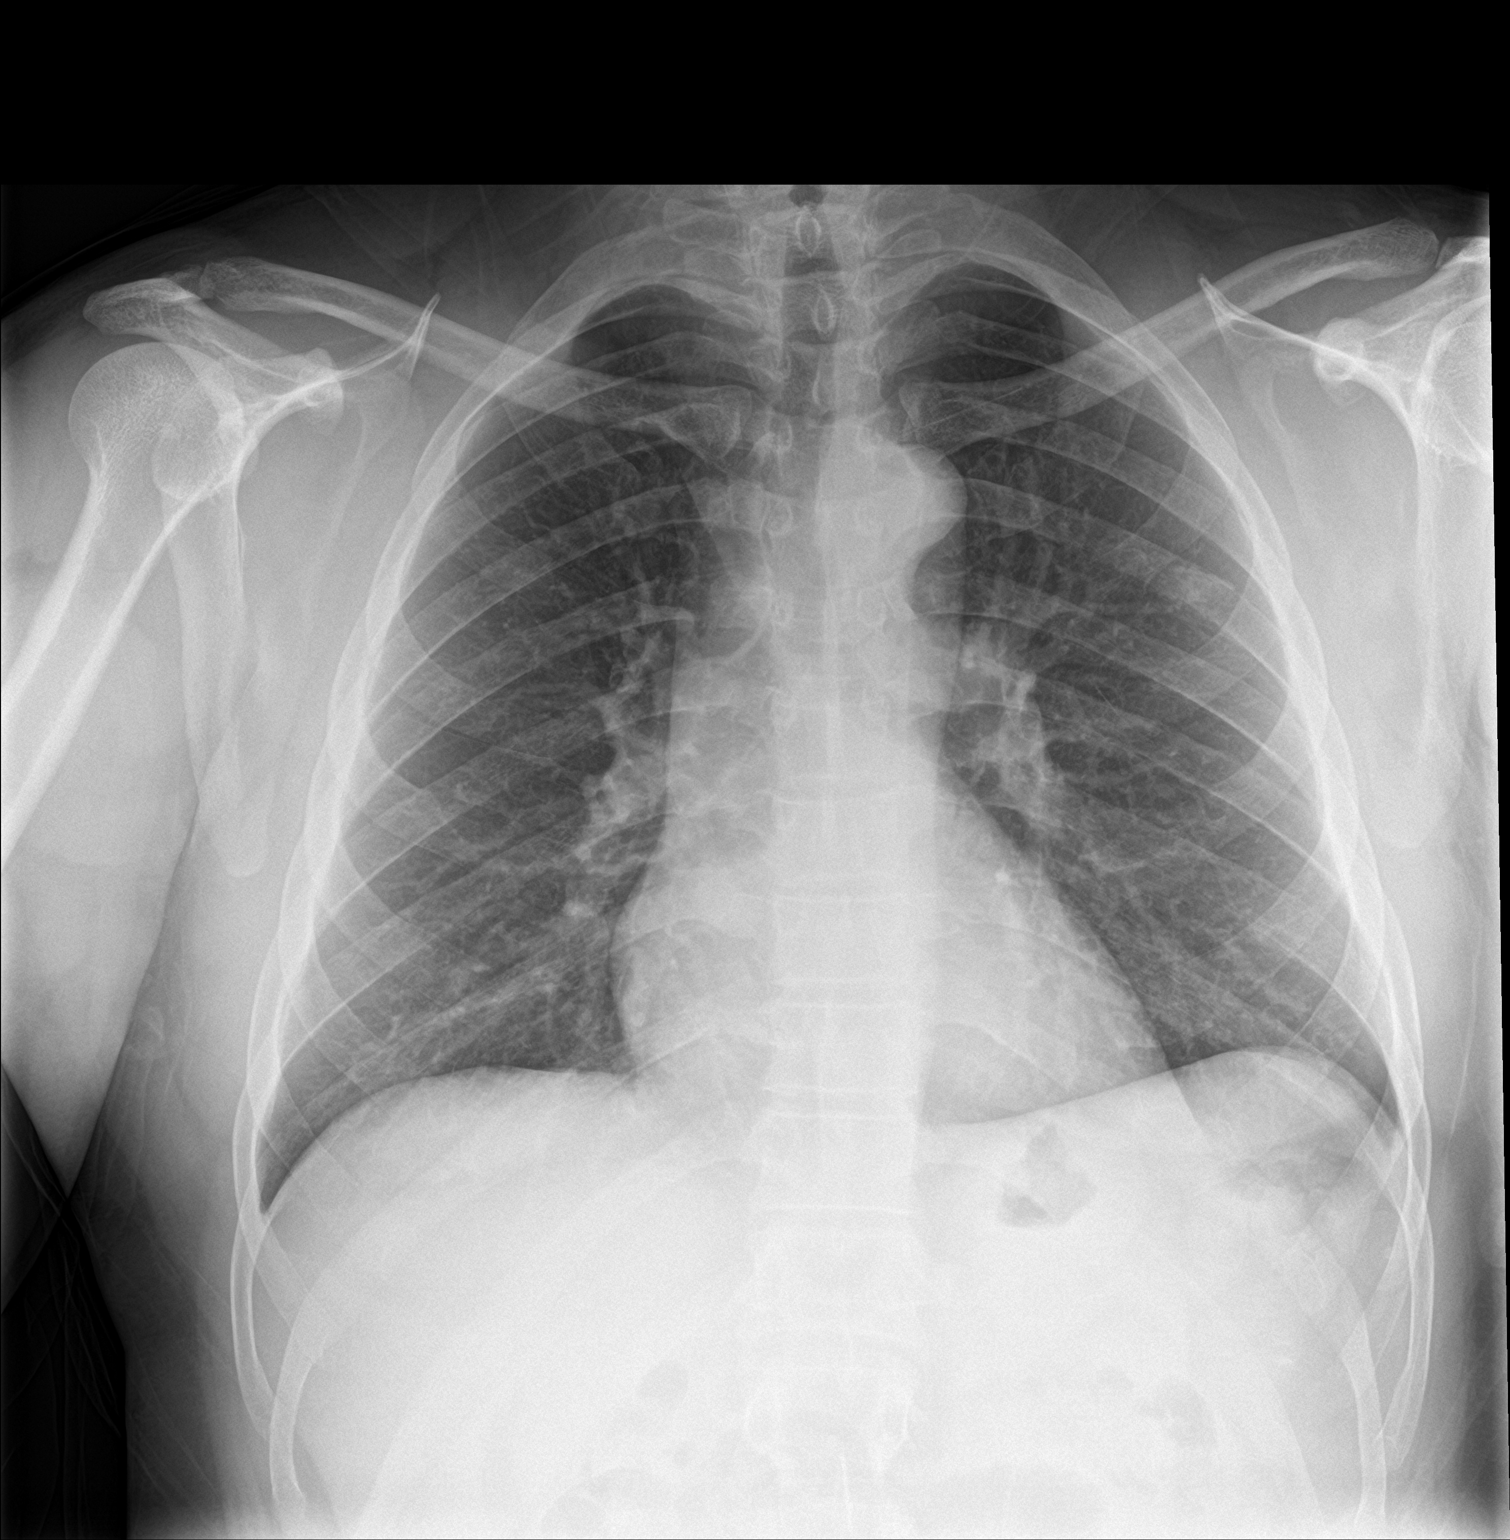

[chest lat]
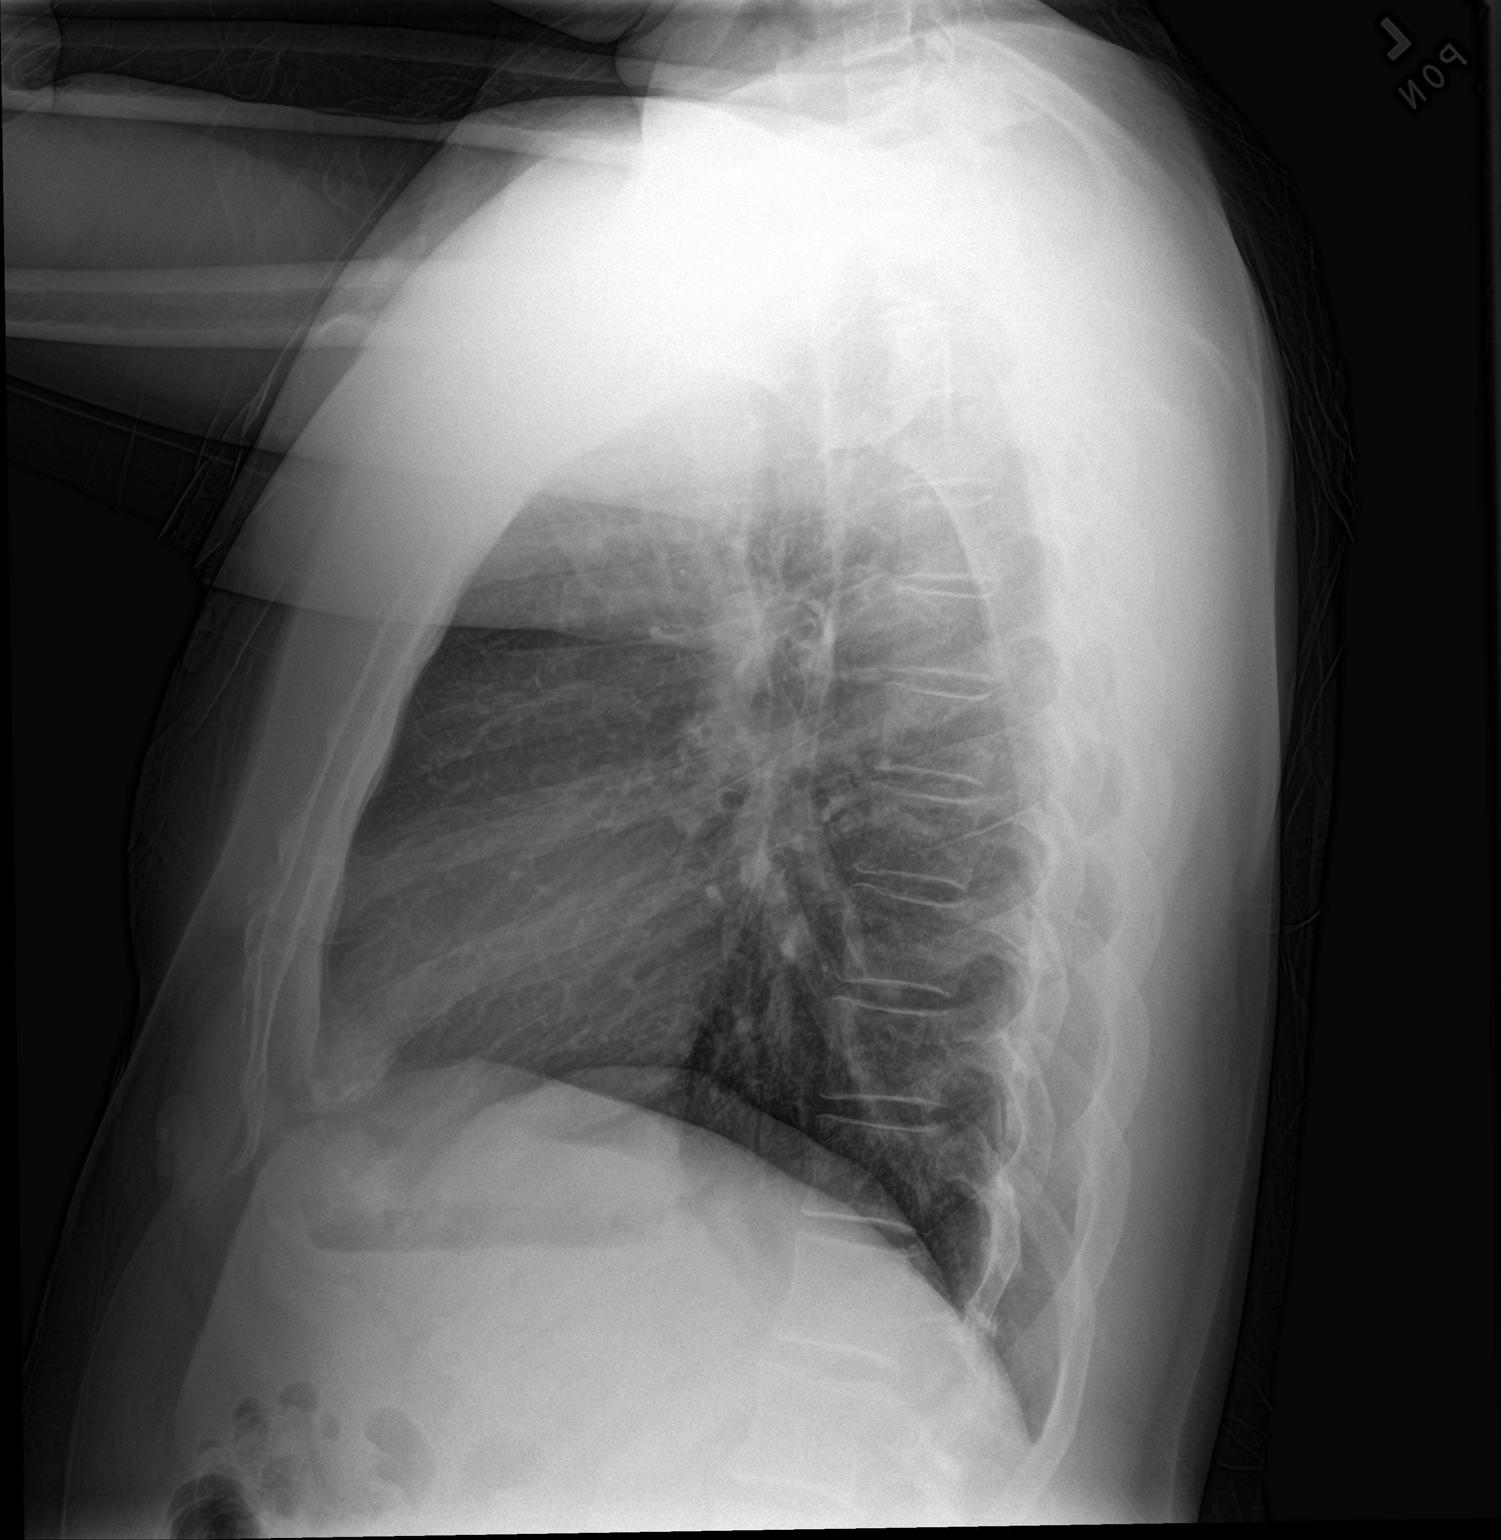

[2 of 2 positions shown; findings below may reference images not displayed]

FINDINGS: Lungs are clear. Heart size and pulmonary vascularity are normal. No
adenopathy. No pneumothorax. No bone lesions.
IMPRESSION: Lungs clear.  Cardiac silhouette normal.

## 2021-09-08 ENCOUNTER — Other Ambulatory Visit: Payer: Self-pay | Admitting: Internal Medicine

## 2021-09-08 DIAGNOSIS — B20 Human immunodeficiency virus [HIV] disease: Secondary | ICD-10-CM

## 2021-09-10 ENCOUNTER — Other Ambulatory Visit: Payer: Self-pay

## 2021-09-10 DIAGNOSIS — Z79899 Other long term (current) drug therapy: Secondary | ICD-10-CM

## 2021-09-10 DIAGNOSIS — B20 Human immunodeficiency virus [HIV] disease: Secondary | ICD-10-CM

## 2021-09-14 ENCOUNTER — Other Ambulatory Visit: Payer: BC Managed Care – PPO

## 2021-09-14 ENCOUNTER — Other Ambulatory Visit: Payer: Self-pay

## 2021-09-14 DIAGNOSIS — Z79899 Other long term (current) drug therapy: Secondary | ICD-10-CM

## 2021-09-14 DIAGNOSIS — B20 Human immunodeficiency virus [HIV] disease: Secondary | ICD-10-CM

## 2021-09-15 LAB — T-HELPER CELL (CD4) - (RCID CLINIC ONLY)
CD4 % Helper T Cell: 53 % (ref 33–65)
CD4 T Cell Abs: 854 /uL (ref 400–1790)

## 2021-09-16 LAB — COMPLETE METABOLIC PANEL WITH GFR
AG Ratio: 1.6 (calc) (ref 1.0–2.5)
ALT: 42 U/L (ref 9–46)
AST: 28 U/L (ref 10–40)
Albumin: 4.6 g/dL (ref 3.6–5.1)
Alkaline phosphatase (APISO): 56 U/L (ref 36–130)
BUN: 16 mg/dL (ref 7–25)
CO2: 28 mmol/L (ref 20–32)
Calcium: 10.1 mg/dL (ref 8.6–10.3)
Chloride: 103 mmol/L (ref 98–110)
Creat: 1.2 mg/dL (ref 0.60–1.29)
Globulin: 2.9 g/dL (calc) (ref 1.9–3.7)
Glucose, Bld: 93 mg/dL (ref 65–99)
Potassium: 4.3 mmol/L (ref 3.5–5.3)
Sodium: 138 mmol/L (ref 135–146)
Total Bilirubin: 0.5 mg/dL (ref 0.2–1.2)
Total Protein: 7.5 g/dL (ref 6.1–8.1)
eGFR: 75 mL/min/{1.73_m2} (ref >=60)

## 2021-09-16 LAB — LIPID PANEL
Cholesterol: 192 mg/dL (ref <200)
HDL: 52 mg/dL (ref >=40)
LDL Cholesterol (Calc): 120 mg/dL (calc) — ABNORMAL HIGH
Non-HDL Cholesterol (Calc): 140 mg/dL (calc) — ABNORMAL HIGH (ref <130)
Total CHOL/HDL Ratio: 3.7 (calc) (ref <5.0)
Triglycerides: 94 mg/dL (ref <150)

## 2021-09-16 LAB — CBC WITH DIFFERENTIAL/PLATELET
Absolute Monocytes: 369 cells/uL (ref 200–950)
Basophils Absolute: 49 cells/uL (ref 0–200)
Basophils Relative: 1.2 %
Eosinophils Absolute: 98 cells/uL (ref 15–500)
Eosinophils Relative: 2.4 %
HCT: 44 % (ref 38.5–50.0)
Hemoglobin: 15.1 g/dL (ref 13.2–17.1)
Lymphs Abs: 1722 cells/uL (ref 850–3900)
MCH: 32.6 pg (ref 27.0–33.0)
MCHC: 34.3 g/dL (ref 32.0–36.0)
MCV: 95 fL (ref 80.0–100.0)
MPV: 9.7 fL (ref 7.5–12.5)
Monocytes Relative: 9 %
Neutro Abs: 1861 cells/uL (ref 1500–7800)
Neutrophils Relative %: 45.4 %
Platelets: 284 10*3/uL (ref 140–400)
RBC: 4.63 10*6/uL (ref 4.20–5.80)
RDW: 12.3 % (ref 11.0–15.0)
Total Lymphocyte: 42 %
WBC: 4.1 10*3/uL (ref 3.8–10.8)

## 2021-09-16 LAB — HIV-1 RNA QUANT-NO REFLEX-BLD
HIV 1 RNA Quant: NOT DETECTED Copies/mL
HIV-1 RNA Quant, Log: NOT DETECTED Log cps/mL

## 2021-09-28 ENCOUNTER — Encounter: Payer: Self-pay | Admitting: Internal Medicine

## 2021-09-28 ENCOUNTER — Ambulatory Visit (INDEPENDENT_AMBULATORY_CARE_PROVIDER_SITE_OTHER): Payer: BC Managed Care – PPO

## 2021-09-28 ENCOUNTER — Other Ambulatory Visit: Payer: Self-pay

## 2021-09-28 ENCOUNTER — Ambulatory Visit: Payer: BC Managed Care – PPO | Admitting: Internal Medicine

## 2021-09-28 VITALS — BP 131/88 | HR 55 | Temp 98.0°F | Ht 67.0 in | Wt 201.0 lb

## 2021-09-28 DIAGNOSIS — B20 Human immunodeficiency virus [HIV] disease: Secondary | ICD-10-CM

## 2021-09-28 DIAGNOSIS — Z23 Encounter for immunization: Secondary | ICD-10-CM | POA: Diagnosis not present

## 2021-09-28 DIAGNOSIS — E7800 Pure hypercholesterolemia, unspecified: Secondary | ICD-10-CM

## 2021-09-28 DIAGNOSIS — Z79899 Other long term (current) drug therapy: Secondary | ICD-10-CM | POA: Diagnosis not present

## 2021-09-28 DIAGNOSIS — Z0000 Encounter for general adult medical examination without abnormal findings: Secondary | ICD-10-CM

## 2021-09-28 NOTE — Progress Notes (Signed)
   Covid-19 Vaccination Clinic  Name:  Jerome Robbins    MRN: [id number] DOB: 1976-05-20  09/28/2021  Mr. Jerome Robbins was observed post Covid-19 immunization for 15 minutes without incident. He was provided with Vaccine Information Sheet and instruction to access the V-Safe system.   Mr. Jerome Robbins was instructed to call 911 with any severe reactions post vaccine: Difficulty breathing  Swelling of face and throat  A fast heartbeat  A bad rash all over body  Dizziness and weakness    Andree Coss, RN

## 2021-09-28 NOTE — Progress Notes (Signed)
RFV: follow up for hiv disease  Patient ID: Jerome Robbins, male   DOB: Jan 17, 1976, 45 y.o.   MRN: [id number]  HPI 45yo M with HIV disease, well-controlled with genvoya. CD 4 count of 854/ VL< 20 (Oct 2022);  Doing well. He reports that he is not working out as much as in the summer. Doing more travel for work. Overall heathly.  He has had colonoscopy in dec 2021 - next due in 2031.   Outpatient Encounter Medications as of 09/28/2021  Medication Sig   GENVOYA 150-150-200-10 MG TABS tablet TAKE 1 TABLET BY MOUTH  DAILY WITH BREAKFAST   No facility-administered encounter medications on file as of 09/28/2021.     Patient Active Problem List   Diagnosis Date Noted   HIV disease (HCC) 07/10/2014   Exposure to syphilis 07/10/2014     Health Maintenance Due  Topic Date Due   COLONOSCOPY (Pts 45-108yrs Insurance coverage will need to be confirmed)  Never done   COVID-19 Vaccine (4 - Booster for Moderna series) 02/11/2021   INFLUENZA VACCINE  07/13/2021    Social History   Tobacco Use   Smoking status: Never   Smokeless tobacco: Never  Substance Use Topics   Alcohol use: Yes    Alcohol/week: 2.0 standard drinks    Types: 2 Glasses of wine per week   Drug use: No     Review of Systems  Constitutional: Negative for fever, chills, diaphoresis, activity change, appetite change, fatigue and unexpected weight change.  HENT: Negative for congestion, sore throat, rhinorrhea, sneezing, trouble swallowing and sinus pressure.  Eyes: Negative for photophobia and visual disturbance.  Respiratory: Negative for cough, chest tightness, shortness of breath, wheezing and stridor.  Cardiovascular: Negative for chest pain, palpitations and leg swelling.  Gastrointestinal: Negative for nausea, vomiting, abdominal pain, diarrhea, constipation, blood in stool, abdominal distention and anal bleeding.  Genitourinary: Negative for dysuria, hematuria, flank pain and difficulty urinating.   Musculoskeletal: Negative for myalgias, back pain, joint swelling, arthralgias and gait problem.  Skin: Negative for color change, pallor, rash and wound.  Neurological: Negative for dizziness, tremors, weakness and light-headedness.  Hematological: Negative for adenopathy. Does not bruise/bleed easily.  Psychiatric/Behavioral: Negative for behavioral problems, confusion, sleep disturbance, dysphoric mood, decreased concentration and agitation.   Physical Exam   BP 131/88   Pulse (!) 55   Temp 98 F (36.7 C) (Oral)   Ht 5\' 7"  (1.702 m)   Wt 201 lb (91.2 kg)   SpO2 97%   BMI 31.48 kg/m   Physical Exam  Constitutional: He is oriented to person, place, and time. He appears well-developed and well-nourished. No distress.  HENT:  Mouth/Throat: Oropharynx is clear and moist. No oropharyngeal exudate.  Cardiovascular: Normal rate, regular rhythm and normal heart sounds. Exam reveals no gallop and no friction rub.  No murmur heard.  Pulmonary/Chest: Effort normal and breath sounds normal. No respiratory distress. He has no wheezes.  Lymphadenopathy:  He has no cervical adenopathy.  Neurological: He is alert and oriented to person, place, and time.  Skin: Skin is warm and dry. No rash noted. No erythema.  Psychiatric: He has a normal mood and affect. His behavior is normal.   Lab Results  Component Value Date   CD4TCELL 53 09/14/2021   Lab Results  Component Value Date   CD4TABS 854 09/14/2021   CD4TABS 916 03/02/2021   CD4TABS 754 08/25/2020   Lab Results  Component Value Date   HIV1RNAQUANT Not Detected 09/14/2021  Lab Results  Component Value Date   HEPBSAB POS (A) 06/05/2014   Lab Results  Component Value Date   LABRPR NON-REACTIVE 03/02/2021    CBC Lab Results  Component Value Date   WBC 4.1 09/14/2021   RBC 4.63 09/14/2021   HGB 15.1 09/14/2021   HCT 44.0 09/14/2021   PLT 284 09/14/2021   MCV 95.0 09/14/2021   MCH 32.6 09/14/2021   MCHC 34.3 09/14/2021    RDW 12.3 09/14/2021   LYMPHSABS 1,722 09/14/2021   MONOABS 376 11/29/2016   EOSABS 98 09/14/2021    BMET Lab Results  Component Value Date   NA 138 09/14/2021   K 4.3 09/14/2021   CL 103 09/14/2021   CO2 28 09/14/2021   GLUCOSE 93 09/14/2021   BUN 16 09/14/2021   CREATININE 1.20 09/14/2021   CALCIUM 10.1 09/14/2021   GFRNONAA 65 03/02/2021   GFRAA 76 03/02/2021      Assessment and Plan HIV disease= well controlled. Continue on genvoya. Not having any drug interactions/ since no other meds  Long term medication management = cr is stable  Elevated LDL = recommended diet modification and exercise  Health maintenance =  - will receive bivalent booster today in clinic - flu at pcp this week - can see back in 6 months

## 2021-09-30 DIAGNOSIS — R7303 Prediabetes: Secondary | ICD-10-CM | POA: Insufficient documentation

## 2021-12-02 ENCOUNTER — Other Ambulatory Visit: Payer: Self-pay | Admitting: Internal Medicine

## 2021-12-02 DIAGNOSIS — B20 Human immunodeficiency virus [HIV] disease: Secondary | ICD-10-CM

## 2022-03-15 ENCOUNTER — Other Ambulatory Visit (HOSPITAL_COMMUNITY)
Admission: RE | Admit: 2022-03-15 | Discharge: 2022-03-15 | Disposition: A | Discharge status: Home / Self Care | Payer: BC Managed Care – PPO | Source: Ambulatory Visit | Attending: Internal Medicine | Admitting: Internal Medicine

## 2022-03-15 ENCOUNTER — Other Ambulatory Visit: Payer: Self-pay

## 2022-03-15 ENCOUNTER — Other Ambulatory Visit: Payer: BC Managed Care – PPO

## 2022-03-15 DIAGNOSIS — B20 Human immunodeficiency virus [HIV] disease: Secondary | ICD-10-CM | POA: Insufficient documentation

## 2022-03-15 DIAGNOSIS — Z113 Encounter for screening for infections with a predominantly sexual mode of transmission: Secondary | ICD-10-CM

## 2022-03-16 LAB — URINE CYTOLOGY ANCILLARY ONLY
Chlamydia: NEGATIVE
Comment: NEGATIVE
Comment: NORMAL
Neisseria Gonorrhea: NEGATIVE

## 2022-03-16 LAB — T-HELPER CELL (CD4) - (RCID CLINIC ONLY)
CD4 % Helper T Cell: 46 % (ref 33–65)
CD4 T Cell Abs: 842 /uL (ref 400–1790)

## 2022-03-18 LAB — COMPLETE METABOLIC PANEL WITH GFR
AG Ratio: 1.5 (calc) (ref 1.0–2.5)
ALT: 31 U/L (ref 9–46)
AST: 29 U/L (ref 10–40)
Albumin: 4.6 g/dL (ref 3.6–5.1)
Alkaline phosphatase (APISO): 58 U/L (ref 36–130)
BUN/Creatinine Ratio: 13 (calc) (ref 6–22)
BUN: 19 mg/dL (ref 7–25)
CO2: 26 mmol/L (ref 20–32)
Calcium: 9.8 mg/dL (ref 8.6–10.3)
Chloride: 104 mmol/L (ref 98–110)
Creat: 1.47 mg/dL — ABNORMAL HIGH (ref 0.60–1.29)
Globulin: 3 g/dL (calc) (ref 1.9–3.7)
Glucose, Bld: 94 mg/dL (ref 65–99)
Potassium: 4.2 mmol/L (ref 3.5–5.3)
Sodium: 138 mmol/L (ref 135–146)
Total Bilirubin: 0.6 mg/dL (ref 0.2–1.2)
Total Protein: 7.6 g/dL (ref 6.1–8.1)
eGFR: 59 mL/min/{1.73_m2} — ABNORMAL LOW (ref >=60)

## 2022-03-18 LAB — CBC WITH DIFFERENTIAL/PLATELET
Absolute Monocytes: 350 cells/uL (ref 200–950)
Basophils Absolute: 32 cells/uL (ref 0–200)
Basophils Relative: 0.7 %
Eosinophils Absolute: 78 cells/uL (ref 15–500)
Eosinophils Relative: 1.7 %
HCT: 43.3 % (ref 38.5–50.0)
Hemoglobin: 14.7 g/dL (ref 13.2–17.1)
Lymphs Abs: 1799 cells/uL (ref 850–3900)
MCH: 32.5 pg (ref 27.0–33.0)
MCHC: 33.9 g/dL (ref 32.0–36.0)
MCV: 95.6 fL (ref 80.0–100.0)
MPV: 10.1 fL (ref 7.5–12.5)
Monocytes Relative: 7.6 %
Neutro Abs: 2341 cells/uL (ref 1500–7800)
Neutrophils Relative %: 50.9 %
Platelets: 286 10*3/uL (ref 140–400)
RBC: 4.53 10*6/uL (ref 4.20–5.80)
RDW: 12.7 % (ref 11.0–15.0)
Total Lymphocyte: 39.1 %
WBC: 4.6 10*3/uL (ref 3.8–10.8)

## 2022-03-18 LAB — HIV-1 RNA QUANT-NO REFLEX-BLD
HIV 1 RNA Quant: NOT DETECTED Copies/mL
HIV-1 RNA Quant, Log: NOT DETECTED Log cps/mL

## 2022-03-18 LAB — RPR: RPR Ser Ql: NONREACTIVE

## 2022-03-29 ENCOUNTER — Encounter: Payer: Self-pay | Admitting: Internal Medicine

## 2022-03-29 ENCOUNTER — Ambulatory Visit: Payer: BC Managed Care – PPO | Admitting: Internal Medicine

## 2022-03-29 ENCOUNTER — Other Ambulatory Visit: Payer: Self-pay

## 2022-03-29 VITALS — BP 134/85 | HR 53 | Temp 98.2°F | Resp 16 | Ht 67.0 in | Wt 203.6 lb

## 2022-03-29 DIAGNOSIS — B20 Human immunodeficiency virus [HIV] disease: Secondary | ICD-10-CM

## 2022-03-29 DIAGNOSIS — Z79899 Other long term (current) drug therapy: Secondary | ICD-10-CM

## 2022-03-29 DIAGNOSIS — N179 Acute kidney failure, unspecified: Secondary | ICD-10-CM | POA: Diagnosis not present

## 2022-03-29 LAB — BASIC METABOLIC PANEL
BUN: 13 mg/dL (ref 7–25)
CO2: 30 mmol/L (ref 20–32)
Calcium: 9.3 mg/dL (ref 8.6–10.3)
Chloride: 106 mmol/L (ref 98–110)
Creat: 1.27 mg/dL (ref 0.60–1.29)
Glucose, Bld: 104 mg/dL — ABNORMAL HIGH (ref 65–99)
Potassium: 4.7 mmol/L (ref 3.5–5.3)
Sodium: 140 mmol/L (ref 135–146)

## 2022-03-29 MED ORDER — BICTEGRAVIR-EMTRICITAB-TENOFOV 50-200-25 MG PO TABS: 1.0000 | ORAL_TABLET | Freq: Every day | ORAL | 11 refills | Status: DC

## 2022-03-29 NOTE — Progress Notes (Signed)
? ? ?Patient ID: Jerome Robbins, male   DOB: [birth year], 46 y.o.   MRN: [id number] ? ?HPI ?Jerome Robbins is a 46yo M with well controlled hiv disease, hx of Pre-hypertensive, and ckd2. He is concerned about recent labs with elevated creatinine since he has family hx of kidney disease and htn. ?(Husband, Jerome Robbins, sees Jerome Robbins). He reports that he is traveling more for work, like pre-covid. Heading up  ?To have his Mother moved in with them to take care of her. ? ? ?Outpatient Encounter Medications as of 03/29/2022  ?Medication Sig  ? GENVOYA 150-150-200-10 MG TABS tablet TAKE 1 TABLET BY MOUTH  DAILY WITH BREAKFAST  ? ?No facility-administered encounter medications on file as of 03/29/2022.  ?  ? ?Patient Active Problem List  ? Diagnosis Date Noted  ? HIV disease (HCC) 07/10/2014  ? Exposure to syphilis 07/10/2014  ? ? ? ?Health Maintenance Due  ?Topic Date Due  ? COLONOSCOPY (Pts 45-109yrs Insurance coverage will need to be confirmed)  Never done  ?  ? ?Review of Systems ?Review of Systems  ?Constitutional: Negative for fever, chills, diaphoresis, activity change, appetite change, fatigue and unexpected weight change.  ?HENT: Negative for congestion, sore throat, rhinorrhea, sneezing, trouble swallowing and sinus pressure.  ?Eyes: Negative for photophobia and visual disturbance.  ?Respiratory: Negative for cough, chest tightness, shortness of breath, wheezing and stridor.  ?Cardiovascular: Negative for chest pain, palpitations and leg swelling.  ?Gastrointestinal: Negative for nausea, vomiting, abdominal pain, diarrhea, constipation, blood in stool, abdominal distention and anal bleeding.  ?Genitourinary: Negative for dysuria, hematuria, flank pain and difficulty urinating.  ?Musculoskeletal: Negative for myalgias, back pain, joint swelling, arthralgias and gait problem.  ?Skin: Negative for color change, pallor, rash and wound.  ?Neurological: Negative for dizziness, tremors, weakness and light-headedness.  ?Hematological:  Negative for adenopathy. Does not bruise/bleed easily.  ?Psychiatric/Behavioral: Negative for behavioral problems, confusion, sleep disturbance, dysphoric mood, decreased concentration and agitation.  ? ?Physical Exam  ? ?BP 134/85   Pulse (!) 53   Temp 98.2 ?F (36.8 ?C) (Oral)   Resp 16   Ht 5\' 7"  (1.702 m)   Wt 203 lb 9.6 oz (92.4 kg)   SpO2 97%   BMI 31.89 kg/m?   ?Physical Exam  ?Constitutional: He is oriented to person, place, and time. He appears well-developed and well-nourished. No distress.  ?HENT:  ?Mouth/Throat: Oropharynx is clear and moist. No oropharyngeal exudate.  ?Cardiovascular: Normal rate, regular rhythm and normal heart sounds. Exam reveals no gallop and no friction rub.  ?No murmur heard.  ?Pulmonary/Chest: Effort normal and breath sounds normal. No respiratory distress. He has no wheezes.  ?Neurological: He is alert and oriented to person, place, and time.  ?Skin: Skin is warm and dry. No rash noted. No erythema.  ?Psychiatric: He has a normal mood and affect. His behavior is normal.  ? ?Lab Results  ?Component Value Date  ? CD4TCELL 46 03/15/2022  ? ?Lab Results  ?Component Value Date  ? CD4TABS 842 03/15/2022  ? CD4TABS 854 09/14/2021  ? CD4TABS 916 03/02/2021  ? ?Lab Results  ?Component Value Date  ? HIV1RNAQUANT Not Detected 03/15/2022  ? ?Lab Results  ?Component Value Date  ? HEPBSAB POS (A) 06/05/2014  ? ?Lab Results  ?Component Value Date  ? LABRPR NON-REACTIVE 03/15/2022  ? ? ?CBC ?Lab Results  ?Component Value Date  ? WBC 4.6 03/15/2022  ? RBC 4.53 03/15/2022  ? HGB 14.7 03/15/2022  ? HCT 43.3 03/15/2022  ? PLT 286 03/15/2022  ?  MCV 95.6 03/15/2022  ? MCH 32.5 03/15/2022  ? MCHC 33.9 03/15/2022  ? RDW 12.7 03/15/2022  ? LYMPHSABS 1,799 03/15/2022  ? MONOABS 376 11/29/2016  ? EOSABS 78 03/15/2022  ? ? ?BMET ?Lab Results  ?Component Value Date  ? NA 138 03/15/2022  ? K 4.2 03/15/2022  ? CL 104 03/15/2022  ? CO2 26 03/15/2022  ? GLUCOSE 94 03/15/2022  ? BUN 19 03/15/2022  ?  CREATININE 1.47 (H) 03/15/2022  ? CALCIUM 9.8 03/15/2022  ? GFRNONAA 65 03/02/2021  ? GFRAA 76 03/02/2021  ? ? ? ? ?Assessment and Plan ?HIV disease= change back to biktarvy so that can remove the cobicistat component/influence on cr ? ?Long term medication management = cr is slightly elevated ? ?Aki = recheck bmp and ua, and proteinuria ? ? ? ?

## 2022-03-30 LAB — MICROALBUMIN / CREATININE URINE RATIO
Creatinine, Urine: 149 mg/dL (ref 20–320)
Microalb Creat Ratio: 1 mcg/mg creat (ref <30)
Microalb, Ur: 0.2 mg/dL

## 2022-03-30 LAB — URINALYSIS, ROUTINE W REFLEX MICROSCOPIC
Bilirubin Urine: NEGATIVE
Glucose, UA: NEGATIVE
Hgb urine dipstick: NEGATIVE
Ketones, ur: NEGATIVE
Leukocytes,Ua: NEGATIVE
Nitrite: NEGATIVE
Protein, ur: NEGATIVE
Specific Gravity, Urine: 1.019 (ref 1.001–1.035)
pH: <=5 (ref 5.0–8.0)

## 2022-05-31 ENCOUNTER — Ambulatory Visit (INDEPENDENT_AMBULATORY_CARE_PROVIDER_SITE_OTHER): Payer: BC Managed Care – PPO | Admitting: Internal Medicine

## 2022-05-31 ENCOUNTER — Other Ambulatory Visit: Payer: Self-pay

## 2022-05-31 VITALS — BP 132/93 | HR 75 | Resp 16 | Ht 67.0 in | Wt 212.2 lb

## 2022-05-31 DIAGNOSIS — N182 Chronic kidney disease, stage 2 (mild): Secondary | ICD-10-CM | POA: Diagnosis not present

## 2022-05-31 DIAGNOSIS — B20 Human immunodeficiency virus [HIV] disease: Secondary | ICD-10-CM | POA: Diagnosis not present

## 2022-05-31 DIAGNOSIS — R030 Elevated blood-pressure reading, without diagnosis of hypertension: Secondary | ICD-10-CM

## 2022-05-31 DIAGNOSIS — Z79899 Other long term (current) drug therapy: Secondary | ICD-10-CM

## 2022-05-31 NOTE — Progress Notes (Unsigned)
RFV: follw up for hiv disease  Patient ID: Jerome Robbins, male   DOB: Sep 16, 1976, 46 y.o.   MRN: [id number]  HPI  46yo M, well controlled hiv disease, CD 4 count 842/VL<20, on biktarvy, doing well with switch to new medication off but has noticed he takes it late. He reports doing well. Just returned from [place]. Concerned about BP slightly elevated  Just got back from [facility] job at [employer] at [employer] for CART therapy to increase access.      Outpatient Encounter Medications as of 05/31/2022  Medication Sig   bictegravir-emtricitabine-tenofovir AF (BIKTARVY) 50-200-25 MG TABS tablet Take 1 tablet by mouth daily.   No facility-administered encounter medications on file as of 05/31/2022.     Patient Active Problem List   Diagnosis Date Noted   HIV disease (HCC) 07/10/2014   Exposure to syphilis 07/10/2014     Health Maintenance Due  Topic Date Due   COLONOSCOPY (Pts 45-59yrs Insurance coverage will need to be confirmed)  Never done     Review of Systems Review of Systems  Constitutional: Negative for fever, chills, diaphoresis, activity change, appetite change, fatigue and unexpected weight change.  HENT: Negative for congestion, sore throat, rhinorrhea, sneezing, trouble swallowing and sinus pressure.  Eyes: Negative for photophobia and visual disturbance.  Respiratory: Negative for cough, chest tightness, shortness of breath, wheezing and stridor.  Cardiovascular: Negative for chest pain, palpitations and leg swelling.  Gastrointestinal: Negative for nausea, vomiting, abdominal pain, diarrhea, constipation, blood in stool, abdominal distention and anal bleeding.  Genitourinary: Negative for dysuria, hematuria, flank pain and difficulty urinating.  Musculoskeletal: Negative for myalgias, back pain, joint swelling, arthralgias and gait problem.  Skin: Negative for color change, pallor, rash and wound.  Neurological: Negative for dizziness, tremors, weakness and  light-headedness.  Hematological: Negative for adenopathy. Does not bruise/bleed easily.  Psychiatric/Behavioral: Negative for behavioral problems, confusion, sleep disturbance, dysphoric mood, decreased concentration and agitation.   Physical Exam   BP (!) 132/93   Pulse 75   Resp 16   Ht 5\' 7"  (1.702 m)   Wt 212 lb 3.2 oz (96.3 kg)   SpO2 95%   BMI 33.24 kg/m   Physical Exam  Constitutional: He is oriented to person, place, and time. He appears well-developed and well-nourished. No distress.  HENT:  Mouth/Throat: Oropharynx is clear and moist. No oropharyngeal exudate.  Cardiovascular: Normal rate, regular rhythm and normal heart sounds. Exam reveals no gallop and no friction rub.  No murmur heard.  Pulmonary/Chest: Effort normal and breath sounds normal. No respiratory distress. He has no wheezes.  Neurological: He is alert and oriented to person, place, and time.  Skin: Skin is warm and dry. No rash noted. No erythema.  Psychiatric: He has a normal mood and affect. His behavior is normal.   Lab Results  Component Value Date   CD4TCELL 46 03/15/2022   Lab Results  Component Value Date   CD4TABS 842 03/15/2022   CD4TABS 854 09/14/2021   CD4TABS 916 03/02/2021   Lab Results  Component Value Date   HIV1RNAQUANT Not Detected 03/15/2022   Lab Results  Component Value Date   HEPBSAB POS (A) 06/05/2014   Lab Results  Component Value Date   LABRPR NON-REACTIVE 03/15/2022    CBC Lab Results  Component Value Date   WBC 4.6 03/15/2022   RBC 4.53 03/15/2022   HGB 14.7 03/15/2022   HCT 43.3 03/15/2022   PLT 286 03/15/2022   MCV 95.6 03/15/2022  MCH 32.5 03/15/2022   MCHC 33.9 03/15/2022   RDW 12.7 03/15/2022   LYMPHSABS 1,799 03/15/2022   MONOABS 376 11/29/2016   EOSABS 78 03/15/2022    BMET Lab Results  Component Value Date   NA 140 03/29/2022   K 4.7 03/29/2022   CL 106 03/29/2022   CO2 30 03/29/2022   GLUCOSE 104 (H) 03/29/2022   BUN 13 03/29/2022    CREATININE 1.27 03/29/2022   CALCIUM 9.3 03/29/2022   GFRNONAA 65 03/02/2021   GFRAA 76 03/02/2021    Assessment and Plan  HIV disease= well controlled. Continue on biktarvy. Will check VL to ensure that it is still well controlled since switch off of cobi  CKD 3 = will check Cr to see that it is decreased since off of cobi.  HTN = recommend - diet modification and weight loss - and check BP at home  Long term medication management = will check BMP

## 2022-06-02 LAB — BASIC METABOLIC PANEL
BUN/Creatinine Ratio: 12 (calc) (ref 6–22)
BUN: 16 mg/dL (ref 7–25)
CO2: 23 mmol/L (ref 20–32)
Calcium: 9.5 mg/dL (ref 8.6–10.3)
Chloride: 106 mmol/L (ref 98–110)
Creat: 1.36 mg/dL — ABNORMAL HIGH (ref 0.60–1.29)
Glucose, Bld: 106 mg/dL — ABNORMAL HIGH (ref 65–99)
Potassium: 4.2 mmol/L (ref 3.5–5.3)
Sodium: 139 mmol/L (ref 135–146)

## 2022-06-02 LAB — HIV-1 RNA QUANT-NO REFLEX-BLD
HIV 1 RNA Quant: NOT DETECTED Copies/mL
HIV-1 RNA Quant, Log: NOT DETECTED Log cps/mL

## 2022-11-17 ENCOUNTER — Other Ambulatory Visit: Payer: BC Managed Care – PPO

## 2022-11-17 ENCOUNTER — Other Ambulatory Visit (HOSPITAL_COMMUNITY)
Admission: RE | Admit: 2022-11-17 | Discharge: 2022-11-17 | Disposition: A | Discharge status: Home / Self Care | Payer: BC Managed Care – PPO | Source: Ambulatory Visit | Attending: Internal Medicine | Admitting: Internal Medicine

## 2022-11-17 ENCOUNTER — Other Ambulatory Visit: Payer: Self-pay

## 2022-11-17 DIAGNOSIS — Z113 Encounter for screening for infections with a predominantly sexual mode of transmission: Secondary | ICD-10-CM | POA: Insufficient documentation

## 2022-11-17 DIAGNOSIS — B20 Human immunodeficiency virus [HIV] disease: Secondary | ICD-10-CM | POA: Insufficient documentation

## 2022-11-17 DIAGNOSIS — Z79899 Other long term (current) drug therapy: Secondary | ICD-10-CM

## 2022-11-18 LAB — URINE CYTOLOGY ANCILLARY ONLY
Chlamydia: NEGATIVE
Comment: NEGATIVE
Comment: NORMAL
Neisseria Gonorrhea: NEGATIVE

## 2022-11-18 LAB — T-HELPER CELL (CD4) - (RCID CLINIC ONLY)
CD4 % Helper T Cell: 47 % (ref 33–65)
CD4 T Cell Abs: 814 /uL (ref 400–1790)

## 2022-11-20 LAB — COMPLETE METABOLIC PANEL WITH GFR
AG Ratio: 1.4 (calc) (ref 1.0–2.5)
ALT: 32 U/L (ref 9–46)
AST: 22 U/L (ref 10–40)
Albumin: 4.5 g/dL (ref 3.6–5.1)
Alkaline phosphatase (APISO): 63 U/L (ref 36–130)
BUN/Creatinine Ratio: 12 (calc) (ref 6–22)
BUN: 16 mg/dL (ref 7–25)
CO2: 25 mmol/L (ref 20–32)
Calcium: 9.5 mg/dL (ref 8.6–10.3)
Chloride: 106 mmol/L (ref 98–110)
Creat: 1.34 mg/dL — ABNORMAL HIGH (ref 0.60–1.29)
Globulin: 3.2 g/dL (calc) (ref 1.9–3.7)
Glucose, Bld: 105 mg/dL — ABNORMAL HIGH (ref 65–99)
Potassium: 4.3 mmol/L (ref 3.5–5.3)
Sodium: 140 mmol/L (ref 135–146)
Total Bilirubin: 0.4 mg/dL (ref 0.2–1.2)
Total Protein: 7.7 g/dL (ref 6.1–8.1)
eGFR: 65 mL/min/{1.73_m2} (ref >=60)

## 2022-11-20 LAB — CBC WITH DIFFERENTIAL/PLATELET
Absolute Monocytes: 383 cells/uL (ref 200–950)
Basophils Absolute: 31 cells/uL (ref 0–200)
Basophils Relative: 0.6 %
Eosinophils Absolute: 102 cells/uL (ref 15–500)
Eosinophils Relative: 2 %
HCT: 42.1 % (ref 38.5–50.0)
Hemoglobin: 14.9 g/dL (ref 13.2–17.1)
Lymphs Abs: 1739 cells/uL (ref 850–3900)
MCH: 33.2 pg — ABNORMAL HIGH (ref 27.0–33.0)
MCHC: 35.4 g/dL (ref 32.0–36.0)
MCV: 93.8 fL (ref 80.0–100.0)
MPV: 10.1 fL (ref 7.5–12.5)
Monocytes Relative: 7.5 %
Neutro Abs: 2846 cells/uL (ref 1500–7800)
Neutrophils Relative %: 55.8 %
Platelets: 296 10*3/uL (ref 140–400)
RBC: 4.49 10*6/uL (ref 4.20–5.80)
RDW: 13 % (ref 11.0–15.0)
Total Lymphocyte: 34.1 %
WBC: 5.1 10*3/uL (ref 3.8–10.8)

## 2022-11-20 LAB — LIPID PANEL
Cholesterol: 185 mg/dL (ref <200)
HDL: 48 mg/dL (ref >=40)
LDL Cholesterol (Calc): 94 mg/dL (calc)
Non-HDL Cholesterol (Calc): 137 mg/dL (calc) — ABNORMAL HIGH (ref <130)
Total CHOL/HDL Ratio: 3.9 (calc) (ref <5.0)
Triglycerides: 321 mg/dL — ABNORMAL HIGH (ref <150)

## 2022-11-20 LAB — RPR: RPR Ser Ql: NONREACTIVE

## 2022-11-20 LAB — HIV-1 RNA QUANT-NO REFLEX-BLD
HIV 1 RNA Quant: NOT DETECTED Copies/mL
HIV-1 RNA Quant, Log: NOT DETECTED Log cps/mL

## 2022-11-25 ENCOUNTER — Other Ambulatory Visit: Payer: BC Managed Care – PPO

## 2022-12-09 ENCOUNTER — Encounter: Payer: BC Managed Care – PPO | Admitting: Internal Medicine

## 2022-12-16 ENCOUNTER — Telehealth (INDEPENDENT_AMBULATORY_CARE_PROVIDER_SITE_OTHER): Payer: BC Managed Care – PPO | Admitting: Internal Medicine

## 2022-12-16 ENCOUNTER — Other Ambulatory Visit: Payer: Self-pay

## 2022-12-16 DIAGNOSIS — B20 Human immunodeficiency virus [HIV] disease: Secondary | ICD-10-CM

## 2022-12-16 DIAGNOSIS — Z8719 Personal history of other diseases of the digestive system: Secondary | ICD-10-CM

## 2022-12-16 DIAGNOSIS — N182 Chronic kidney disease, stage 2 (mild): Secondary | ICD-10-CM

## 2022-12-16 MED ORDER — BICTEGRAVIR-EMTRICITAB-TENOFOV 50-200-25 MG PO TABS: 1.0000 | ORAL_TABLET | Freq: Every day | ORAL | 11 refills | Status: DC

## 2022-12-16 NOTE — Progress Notes (Signed)
  Virtual Visit via Video Note  I connected with Jerome Robbins on 12/16/22 at 10:15 AM EST by a video enabled telemedicine application and verified that I am speaking with the correct person using two identifiers.  Location: Patient: at home Provider: at clinic   I discussed the limitations of evaluation and management by telemedicine and the availability of in person appointments. The patient expressed understanding and agreed to proceed.  History of Present Illness: Recovering from umbilical hernia repair.   Observations/Objective: Afebrile fluent in nad  Was off of work for 3 wk now back No complaints. Her daughter going to [employer] in the Fall He completed hepatitis b vaccine (hep A immune)  Planning for a trip to [place] - for safari this summer  Assessment and Plan: Hiv disease = doing well reviewed lab results Umbilical hernia repair = only took 1 oxycodone after surgery and had hallucination/bad dream and doesn't want to take it   Follow Up Instructions:  Will need fasting labs since unclear if he has new onset HyperTG Also discussed reprieve trial Reviewed hiv labs - gave refills Will have him come in for fasting lipid profile to decide crestor for reprieve or if he needs meds for HyperTG Pretravel vaccination -gave # for [place] travel clinic   I discussed the assessment and treatment plan with the patient. The patient was provided an opportunity to ask questions and all were answered. The patient agreed with the plan and demonstrated an understanding of the instructions.   The patient was advised to call back or seek an in-person evaluation if the symptoms worsen or if the condition fails to improve as anticipated.  I provided 15  minutes of non-face-to-face time during this encounter.   Carlyle Basques, MD

## 2023-04-20 DIAGNOSIS — I10 Essential (primary) hypertension: Secondary | ICD-10-CM | POA: Insufficient documentation

## 2023-04-22 ENCOUNTER — Encounter: Payer: Self-pay | Admitting: Internal Medicine

## 2023-04-27 NOTE — Telephone Encounter (Signed)
Patient has appointment scheduled with Dr. Drue Second on 5/21 to discuss questions.

## 2023-05-03 ENCOUNTER — Telehealth (INDEPENDENT_AMBULATORY_CARE_PROVIDER_SITE_OTHER): Payer: BC Managed Care – PPO | Admitting: Internal Medicine

## 2023-05-03 ENCOUNTER — Other Ambulatory Visit: Payer: Self-pay

## 2023-05-03 DIAGNOSIS — B20 Human immunodeficiency virus [HIV] disease: Secondary | ICD-10-CM | POA: Diagnosis not present

## 2023-05-03 DIAGNOSIS — Z79899 Other long term (current) drug therapy: Secondary | ICD-10-CM | POA: Diagnosis not present

## 2023-05-03 DIAGNOSIS — I10 Essential (primary) hypertension: Secondary | ICD-10-CM | POA: Diagnosis not present

## 2023-05-03 NOTE — Progress Notes (Signed)
Virtual Visit via Video Note  I connected with Jerome Robbins on 05/03/23 at  3:15 PM EDT by a video enabled telemedicine application and verified that I am speaking with the correct person using two identifiers.  Location: Patient: at home ([facility]) Provider: in clinic   I discussed the limitations of evaluation and management by telemedicine and the availability of in person appointments. The patient expressed understanding and agreed to proceed.  History of Present Illness: 46yoM with well controlled hiv disease. Doing well. Not missing doses of biktarvy. Since we last saw him, he was seen by his PCP and she  Started on low dose losartan by pcp Has improved diet and exercise. Had 9 lb weight loss  Going to [place] and [place] with his daughters then in month of July going to [place] for the month Doing well Had repeat lipid profile by pcp and was wnl   Current Outpatient Medications:    bictegravir-emtricitabine-tenofovir AF (BIKTARVY) 50-200-25 MG TABS tablet, Take 1 tablet by mouth daily., Disp: 30 tablet, Rfl: 11    Observations/Objective:   Assessment and Plan: Refill biktarvy Get labs at end of June (53month labs)  Long term medication management = will check cr at next visit   Htn = continue on losartan  Deferred starting primary prevention with pitavastatin  Follow Up Instructions: follow up in 6 months    I discussed the assessment and treatment plan with the patient. The patient was provided an opportunity to ask questions and all were answered. The patient agreed with the plan and demonstrated an understanding of the instructions.   The patient was advised to call back or seek an in-person evaluation if the symptoms worsen or if the condition fails to improve as anticipated.  I have personally spent 32 minutes involved in face-to-face and non-face-to-face activities for this patient on the day of the visit. Professional time spent includes the following  activities: Preparing to see the patient (review of tests), Obtaining and/or reviewing separately obtained history (admission/discharge record), Performing a medically appropriate examination and/or evaluation , Ordering medications/tests/procedures, referring and communicating with other health care professionals, Documenting clinical information in the EMR, Independently interpreting results (not separately reported), Communicating results to the patient/family/caregiver, Counseling and educating the patient/family/caregiver and Care coordination (not separately reported).    Judyann Munson, MD

## 2023-06-08 ENCOUNTER — Other Ambulatory Visit: Payer: Self-pay

## 2023-06-08 ENCOUNTER — Other Ambulatory Visit: Payer: BC Managed Care – PPO

## 2023-06-08 DIAGNOSIS — B20 Human immunodeficiency virus [HIV] disease: Secondary | ICD-10-CM

## 2023-06-08 LAB — CBC WITH DIFFERENTIAL/PLATELET
Absolute Monocytes: 273 cells/uL (ref 200–950)
Basophils Absolute: 29 cells/uL (ref 0–200)
Basophils Relative: 0.7 %
MPV: 10 fL (ref 7.5–12.5)
RDW: 12.9 % (ref 11.0–15.0)
WBC: 4.2 10*3/uL (ref 3.8–10.8)

## 2023-06-09 LAB — LIPID PANEL
Cholesterol: 166 mg/dL (ref <200)
Triglycerides: 87 mg/dL (ref <150)

## 2023-06-09 LAB — T-HELPER CELL (CD4) - (RCID CLINIC ONLY)
CD4 % Helper T Cell: 46 % (ref 33–65)
CD4 T Cell Abs: 640 /uL (ref 400–1790)

## 2023-06-09 LAB — BASIC METABOLIC PANEL
CO2: 23 mmol/L (ref 20–32)
Calcium: 9.6 mg/dL (ref 8.6–10.3)
Chloride: 106 mmol/L (ref 98–110)

## 2023-06-10 LAB — CBC WITH DIFFERENTIAL/PLATELET
Eosinophils Absolute: 50 cells/uL (ref 15–500)
Eosinophils Relative: 1.2 %
HCT: 43.6 % (ref 38.5–50.0)
Hemoglobin: 15 g/dL (ref 13.2–17.1)
Lymphs Abs: 1520 cells/uL (ref 850–3900)
MCH: 33 pg (ref 27.0–33.0)
MCHC: 34.4 g/dL (ref 32.0–36.0)
MCV: 96 fL (ref 80.0–100.0)
Monocytes Relative: 6.5 %
Neutro Abs: 2327 cells/uL (ref 1500–7800)
Neutrophils Relative %: 55.4 %
Platelets: 286 10*3/uL (ref 140–400)
RBC: 4.54 10*6/uL (ref 4.20–5.80)
Total Lymphocyte: 36.2 %

## 2023-06-10 LAB — LIPID PANEL
HDL: 52 mg/dL (ref >=40)
LDL Cholesterol (Calc): 96 mg/dL (calc)
Non-HDL Cholesterol (Calc): 114 mg/dL (calc) (ref <130)
Total CHOL/HDL Ratio: 3.2 (calc) (ref <5.0)

## 2023-06-10 LAB — BASIC METABOLIC PANEL
BUN/Creatinine Ratio: 11 (calc) (ref 6–22)
BUN: 15 mg/dL (ref 7–25)
Creat: 1.35 mg/dL — ABNORMAL HIGH (ref 0.60–1.29)
Glucose, Bld: 93 mg/dL (ref 65–99)
Potassium: 4.5 mmol/L (ref 3.5–5.3)
Sodium: 138 mmol/L (ref 135–146)

## 2023-06-10 LAB — HIV-1 RNA QUANT-NO REFLEX-BLD
HIV 1 RNA Quant: NOT DETECTED Copies/mL
HIV-1 RNA Quant, Log: NOT DETECTED Log cps/mL

## 2023-06-10 LAB — RPR: RPR Ser Ql: NONREACTIVE

## 2023-06-24 ENCOUNTER — Encounter: Payer: Self-pay | Admitting: Internal Medicine

## 2023-06-24 ENCOUNTER — Telehealth (INDEPENDENT_AMBULATORY_CARE_PROVIDER_SITE_OTHER): Payer: BC Managed Care – PPO | Admitting: Internal Medicine

## 2023-06-24 ENCOUNTER — Other Ambulatory Visit: Payer: Self-pay

## 2023-06-24 DIAGNOSIS — Z79899 Other long term (current) drug therapy: Secondary | ICD-10-CM

## 2023-06-24 DIAGNOSIS — B20 Human immunodeficiency virus [HIV] disease: Secondary | ICD-10-CM | POA: Diagnosis not present

## 2023-06-24 DIAGNOSIS — N182 Chronic kidney disease, stage 2 (mild): Secondary | ICD-10-CM | POA: Diagnosis not present

## 2023-06-24 NOTE — Progress Notes (Unsigned)
Virtual Visit via Video Note  I connected with Jerome Robbins on 06/24/23 at 10:30 AM EDT by a video enabled telemedicine application and verified that I am speaking with the correct person using two identifiers.  Location: Patient: at home Provider: in clinic   I discussed the limitations of evaluation and management by telemedicine and the availability of in person appointments. The patient expressed understanding and agreed to proceed.  History of Present Illness:  We controlled hiv disease, CD 4 count of 640/VL<20 on biktarvy. 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 Will have him come in for ua, ur protein: alb  Long term medication management = cr is stable   Follow Up Instructions:    I discussed the assessment and treatment plan with the patient. The patient was provided an opportunity to ask questions and all were answered. The patient agreed with the plan and demonstrated an understanding of the instructions.   The patient was advised to call back or seek an in-person evaluation if the symptoms worsen or if the condition fails to improve as anticipated.  I provided *** minutes of non-face-to-face time during this encounter.   Judyann Munson, MD

## 2023-07-15 ENCOUNTER — Other Ambulatory Visit: Payer: BC Managed Care – PPO

## 2023-07-15 ENCOUNTER — Other Ambulatory Visit: Payer: Self-pay

## 2023-07-15 DIAGNOSIS — N182 Chronic kidney disease, stage 2 (mild): Secondary | ICD-10-CM

## 2023-10-19 ENCOUNTER — Other Ambulatory Visit: Payer: Self-pay | Admitting: Internal Medicine

## 2023-10-19 DIAGNOSIS — B20 Human immunodeficiency virus [HIV] disease: Secondary | ICD-10-CM

## 2023-12-26 ENCOUNTER — Other Ambulatory Visit: Payer: Self-pay | Admitting: Internal Medicine

## 2023-12-26 DIAGNOSIS — B20 Human immunodeficiency virus [HIV] disease: Secondary | ICD-10-CM

## 2024-01-02 ENCOUNTER — Other Ambulatory Visit: Payer: BC Managed Care – PPO

## 2024-01-02 ENCOUNTER — Other Ambulatory Visit: Payer: Self-pay

## 2024-01-02 ENCOUNTER — Other Ambulatory Visit (HOSPITAL_COMMUNITY)
Admission: RE | Admit: 2024-01-02 | Discharge: 2024-01-02 | Disposition: A | Discharge status: Home / Self Care | Payer: BC Managed Care – PPO | Source: Ambulatory Visit | Attending: Internal Medicine | Admitting: Internal Medicine

## 2024-01-02 DIAGNOSIS — Z113 Encounter for screening for infections with a predominantly sexual mode of transmission: Secondary | ICD-10-CM

## 2024-01-02 DIAGNOSIS — Z79899 Other long term (current) drug therapy: Secondary | ICD-10-CM

## 2024-01-02 DIAGNOSIS — B20 Human immunodeficiency virus [HIV] disease: Secondary | ICD-10-CM | POA: Diagnosis present

## 2024-01-03 LAB — T-HELPER CELL (CD4) - (RCID CLINIC ONLY)
CD4 % Helper T Cell: 46 % (ref 33–65)
CD4 T Cell Abs: 944 /uL (ref 400–1790)

## 2024-01-03 LAB — URINE CYTOLOGY ANCILLARY ONLY
Chlamydia: NEGATIVE
Comment: NEGATIVE
Comment: NORMAL
Neisseria Gonorrhea: NEGATIVE

## 2024-01-04 LAB — CBC WITH DIFFERENTIAL/PLATELET
Absolute Lymphocytes: 2295 {cells}/uL (ref 850–3900)
Absolute Monocytes: 342 {cells}/uL (ref 200–950)
Basophils Absolute: 59 {cells}/uL (ref 0–200)
Basophils Relative: 1.3 %
Eosinophils Absolute: 72 {cells}/uL (ref 15–500)
Eosinophils Relative: 1.6 %
HCT: 47 % (ref 38.5–50.0)
Hemoglobin: 16.2 g/dL (ref 13.2–17.1)
MCH: 32.9 pg (ref 27.0–33.0)
MCHC: 34.5 g/dL (ref 32.0–36.0)
MCV: 95.5 fL (ref 80.0–100.0)
MPV: 9.9 fL (ref 7.5–12.5)
Monocytes Relative: 7.6 %
Neutro Abs: 1733 {cells}/uL (ref 1500–7800)
Neutrophils Relative %: 38.5 %
Platelets: 291 10*3/uL (ref 140–400)
RBC: 4.92 10*6/uL (ref 4.20–5.80)
RDW: 12.5 % (ref 11.0–15.0)
Total Lymphocyte: 51 %
WBC: 4.5 10*3/uL (ref 3.8–10.8)

## 2024-01-04 LAB — LIPID PANEL
Cholesterol: 164 mg/dL (ref <200)
HDL: 57 mg/dL (ref >=40)
LDL Cholesterol (Calc): 88 mg/dL
Non-HDL Cholesterol (Calc): 107 mg/dL (ref <130)
Total CHOL/HDL Ratio: 2.9 (calc) (ref <5.0)
Triglycerides: 93 mg/dL (ref <150)

## 2024-01-04 LAB — HIV-1 RNA QUANT-NO REFLEX-BLD
HIV 1 RNA Quant: NOT DETECTED {copies}/mL
HIV-1 RNA Quant, Log: NOT DETECTED {Log}

## 2024-01-04 LAB — COMPLETE METABOLIC PANEL WITH GFR
AG Ratio: 1.7 (calc) (ref 1.0–2.5)
ALT: 99 U/L — ABNORMAL HIGH (ref 9–46)
AST: 56 U/L — ABNORMAL HIGH (ref 10–40)
Albumin: 4.9 g/dL (ref 3.6–5.1)
Alkaline phosphatase (APISO): 77 U/L (ref 36–130)
BUN: 15 mg/dL (ref 7–25)
CO2: 27 mmol/L (ref 20–32)
Calcium: 9.9 mg/dL (ref 8.6–10.3)
Chloride: 105 mmol/L (ref 98–110)
Creat: 1.27 mg/dL (ref 0.60–1.29)
Globulin: 2.9 g/dL (ref 1.9–3.7)
Glucose, Bld: 94 mg/dL (ref 65–99)
Potassium: 4.3 mmol/L (ref 3.5–5.3)
Sodium: 140 mmol/L (ref 135–146)
Total Bilirubin: 0.4 mg/dL (ref 0.2–1.2)
Total Protein: 7.8 g/dL (ref 6.1–8.1)
eGFR: 69 mL/min/{1.73_m2} (ref >=60)

## 2024-01-04 LAB — RPR: RPR Ser Ql: NONREACTIVE

## 2024-01-13 ENCOUNTER — Other Ambulatory Visit: Payer: Self-pay | Admitting: Internal Medicine

## 2024-01-13 DIAGNOSIS — B20 Human immunodeficiency virus [HIV] disease: Secondary | ICD-10-CM

## 2024-01-16 ENCOUNTER — Encounter: Payer: Self-pay | Admitting: Internal Medicine

## 2024-01-16 ENCOUNTER — Other Ambulatory Visit: Payer: Self-pay

## 2024-01-16 ENCOUNTER — Ambulatory Visit: Payer: BC Managed Care – PPO | Admitting: Internal Medicine

## 2024-01-16 VITALS — BP 133/79 | HR 87 | Temp 99.0°F | Resp 16 | Wt 201.2 lb

## 2024-01-16 DIAGNOSIS — B20 Human immunodeficiency virus [HIV] disease: Secondary | ICD-10-CM | POA: Diagnosis not present

## 2024-01-16 DIAGNOSIS — R7401 Elevation of levels of liver transaminase levels: Secondary | ICD-10-CM

## 2024-01-16 DIAGNOSIS — Z79899 Other long term (current) drug therapy: Secondary | ICD-10-CM

## 2024-01-16 MED ORDER — BIKTARVY 50-200-25 MG PO TABS: 1.0000 | ORAL_TABLET | Freq: Every day | ORAL | 3 refills | Status: DC

## 2024-01-16 NOTE — Telephone Encounter (Signed)
 Appt 2/3

## 2024-01-16 NOTE — Progress Notes (Unsigned)
Patient ID: Sholom Dulude, male   DOB: 06-06-1976, 48 y.o.   MRN: [id number]  HPI 944/Vl<20 on jan 2025 labs Mild transaminitis on labs ? Work out?   The patient, with HIV, presents with elevated liver enzymes and for routine follow-up for HIV management.  The patient presents with elevated liver enzymes, with an AST of 56 and ALT of 99, identified during recent lab work. They attribute this elevation to recent physical exercise, as they were working out the day before the labs were drawn. They recall a similar occurrence in the past where liver enzymes increased post-exercise, but not to this extent. They have not consumed alcohol recently, with only a few drinks in December, and have eliminated red meat and pork from their diet. No muscle aches are reported, and they have been actively working out, including the day before the lab tests.  They are managing their HIV with a CD4 count of 944 and an undetectable viral load, indicating effective control of the condition. They are on Biktarvy, which they have recently adjusted to take in the morning to ensure adequate hydration. They are also taking losartan and report no new supplements or herbal medications.  They are actively engaging in lifestyle modifications, including exercise and dietary changes, such as reducing red meat and pork intake. They are exploring nonalcoholic wine options as part of their efforts to improve overall health and fitness.  Mom had to be hospitalized over the holidays - now at home pd.  Soc hx: freshman at [employer]. Youngest - just applied to IB at [employer] or [employer]  [employer].   Outpatient Encounter Medications as of 01/16/2024  Medication Sig   BIKTARVY 50-200-25 MG TABS tablet TAKE 1 TABLET BY MOUTH DAILY   losartan (COZAAR) 25 MG tablet Take 25 mg by mouth daily.   meclizine (ANTIVERT) 25 MG tablet Take by mouth.   No facility-administered encounter medications on file as of  01/16/2024.     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   Syphilis 06/25/2015   HIV disease (HCC) 07/10/2014   Exposure to syphilis 07/10/2014   Asymptomatic human immunodeficiency virus (hiv) infection status (HCC) 07/10/2014     Health Maintenance Due  Topic Date Due   COVID-19 Vaccine (6 - 2024-25 season) 11/28/2023     Review of Systems  Physical Exam   Wt 201 lb 3.2 oz (91.3 kg)   BMI 31.51 kg/m    Lab Results  Component Value Date   CD4TCELL 46 01/02/2024   Lab Results  Component Value Date   CD4TABS 944 01/02/2024   CD4TABS 640 06/08/2023   CD4TABS 814 11/17/2022   Lab Results  Component Value Date   HIV1RNAQUANT Not Detected 01/02/2024   Lab Results  Component Value Date   HEPBSAB POS (A) 06/05/2014   Lab Results  Component Value Date   LABRPR NON-REACTIVE 01/02/2024    CBC Lab Results  Component Value Date   WBC 4.5 01/02/2024   RBC 4.92 01/02/2024   HGB 16.2 01/02/2024   HCT 47.0 01/02/2024   PLT 291 01/02/2024   MCV 95.5 01/02/2024   MCH 32.9 01/02/2024   MCHC 34.5 01/02/2024   RDW 12.5 01/02/2024   LYMPHSABS 1,520 06/08/2023   MONOABS 376 11/29/2016   EOSABS 72 01/02/2024    BMET Lab Results  Component Value Date  NA 140 01/02/2024   K 4.3 01/02/2024   CL 105 01/02/2024   CO2 27 01/02/2024   GLUCOSE 94 01/02/2024   BUN 15 01/02/2024   CREATININE 1.27 01/02/2024   CALCIUM 9.9 01/02/2024   GFRNONAA 65 03/02/2021   GFRAA 76 03/02/2021      Assessment and Plan  HIV Management Currently on Biktarvy with recent labs showing CD4 count of 944 and undetectable viral load, indicating good control. Switched to morning dosing to ensure adequate hydration and support kidney function. - Continue Biktarvy as prescribed - Monitor CD4 count and viral load regularly  Elevated Liver Enzymes Mildly  elevated liver enzymes (AST 56, ALT 99) likely due to recent intense workouts. No new supplements or medications aside from losartan and Biktarvy. No alcohol consumption or muscle aches reported. Previous labs in December were normal. Explained that liver enzyme levels are not concerning unless five times the upper limit of normal. - Monitor liver enzymes - Repeat liver function tests if symptoms persist or worsen  General Health Maintenance Engaging in regular exercise and dietary modifications, including reducing red meat and pork consumption. Reports overall good health and no significant new symptoms. - Encourage continued regular exercise - Maintain current dietary modifications - anal pap at next visit  Follow-up - Schedule next follow-up appointment as per routine HIV management protocol - Repeat liver function tests if symptoms persist or worsen.

## 2024-05-11 NOTE — Progress Notes (Signed)
 The 10-year ASCVD risk score (Arnett DK, et al., 2019) is: 8.1%   Values used to calculate the score:     Age: 48 years     Sex: Male     Is Non-Hispanic African American: Yes     Diabetic: No     Tobacco smoker: No     Systolic Blood Pressure: 133 mmHg     Is BP treated: Yes     HDL Cholesterol: 57 mg/dL     Total Cholesterol: 164 mg/dL  No current statin therapy, next appointment note updated.   Sahra Converse, BSN, RN

## 2024-07-02 ENCOUNTER — Other Ambulatory Visit: Payer: Self-pay

## 2024-07-02 ENCOUNTER — Other Ambulatory Visit: Payer: BC Managed Care – PPO

## 2024-07-02 DIAGNOSIS — B20 Human immunodeficiency virus [HIV] disease: Secondary | ICD-10-CM

## 2024-07-03 LAB — T-HELPER CELL (CD4) - (RCID CLINIC ONLY)
CD4 % Helper T Cell: 49 % (ref 33–65)
CD4 T Cell Abs: 1074 /uL (ref 400–1790)

## 2024-07-04 LAB — COMPLETE METABOLIC PANEL WITHOUT GFR
AG Ratio: 1.6 (calc) (ref 1.0–2.5)
ALT: 24 U/L (ref 9–46)
AST: 20 U/L (ref 10–35)
Albumin: 4.4 g/dL (ref 3.6–5.1)
Alkaline phosphatase (APISO): 49 U/L (ref 35–144)
BUN/Creatinine Ratio: 15 (calc) (ref 6–22)
BUN: 21 mg/dL (ref 7–25)
CO2: 24 mmol/L (ref 20–32)
Calcium: 9.5 mg/dL (ref 8.6–10.3)
Chloride: 106 mmol/L (ref 98–110)
Creat: 1.36 mg/dL — ABNORMAL HIGH (ref 0.70–1.30)
Globulin: 2.7 g/dL (ref 1.9–3.7)
Glucose, Bld: 99 mg/dL (ref 65–99)
Potassium: 4.7 mmol/L (ref 3.5–5.3)
Sodium: 138 mmol/L (ref 135–146)
Total Bilirubin: 0.4 mg/dL (ref 0.2–1.2)
Total Protein: 7.1 g/dL (ref 6.1–8.1)

## 2024-07-04 LAB — CBC WITH DIFFERENTIAL/PLATELET
Absolute Lymphocytes: 2215 {cells}/uL (ref 850–3900)
Absolute Monocytes: 396 {cells}/uL (ref 200–950)
Basophils Absolute: 52 {cells}/uL (ref 0–200)
Basophils Relative: 1.2 %
Eosinophils Absolute: 69 {cells}/uL (ref 15–500)
Eosinophils Relative: 1.6 %
HCT: 43.8 % (ref 38.5–50.0)
Hemoglobin: 14.5 g/dL (ref 13.2–17.1)
MCH: 32.2 pg (ref 27.0–33.0)
MCHC: 33.1 g/dL (ref 32.0–36.0)
MCV: 97.1 fL (ref 80.0–100.0)
MPV: 9.4 fL (ref 7.5–12.5)
Monocytes Relative: 9.2 %
Neutro Abs: 1570 {cells}/uL (ref 1500–7800)
Neutrophils Relative %: 36.5 %
Platelets: 288 Thousand/uL (ref 140–400)
RBC: 4.51 Million/uL (ref 4.20–5.80)
RDW: 12.7 % (ref 11.0–15.0)
Total Lymphocyte: 51.5 %
WBC: 4.3 Thousand/uL (ref 3.8–10.8)

## 2024-07-04 LAB — HIV-1 RNA QUANT-NO REFLEX-BLD
HIV 1 RNA Quant: NOT DETECTED {copies}/mL
HIV-1 RNA Quant, Log: NOT DETECTED {Log_copies}/mL

## 2024-07-16 ENCOUNTER — Ambulatory Visit: Payer: Self-pay | Admitting: Internal Medicine

## 2024-07-17 ENCOUNTER — Encounter: Payer: Self-pay | Admitting: Internal Medicine

## 2024-08-10 ENCOUNTER — Ambulatory Visit: Admitting: Internal Medicine

## 2024-08-15 ENCOUNTER — Other Ambulatory Visit: Payer: Self-pay

## 2024-08-15 ENCOUNTER — Ambulatory Visit: Admitting: Internal Medicine

## 2024-08-15 ENCOUNTER — Encounter: Payer: Self-pay | Admitting: Internal Medicine

## 2024-08-15 VITALS — BP 126/91 | HR 68 | Temp 97.6°F | Ht 67.0 in | Wt 215.0 lb

## 2024-08-15 DIAGNOSIS — B20 Human immunodeficiency virus [HIV] disease: Secondary | ICD-10-CM

## 2024-08-15 DIAGNOSIS — Z23 Encounter for immunization: Secondary | ICD-10-CM | POA: Diagnosis not present

## 2024-08-15 DIAGNOSIS — N182 Chronic kidney disease, stage 2 (mild): Secondary | ICD-10-CM | POA: Diagnosis not present

## 2024-08-15 MED ORDER — BIKTARVY 50-200-25 MG PO TABS: 1.0000 | ORAL_TABLET | Freq: Every day | ORAL | 3 refills | Status: AC

## 2024-08-15 NOTE — Progress Notes (Signed)
 Patient ID: Jerome Robbins, male   DOB: 1976/01/19, 48 y.o.   MRN: [id number]  HPI  48yo M with HIV disease, well controlled on biktarvy . Has excellent adherence. Overall doing well. Celebrated his 48th this summer.  Youngest daughter -Merrianne -- in [place]. Oldest is 2nd yr at [employer]. 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   Outpatient Encounter Medications as of 08/15/2024  Medication Sig   bictegravir-emtricitabine-tenofovir AF (BIKTARVY ) 50-200-25 MG TABS tablet Take 1 tablet by mouth daily.   losartan (COZAAR) 25 MG tablet Take 25 mg by mouth daily.   meclizine (ANTIVERT) 25 MG tablet Take by mouth.   No facility-administered encounter medications on file as of 08/15/2024.     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  HENT: Negative for congestion, sore throat, rhinorrhea, sneezing, trouble swallowing and sinus pressure.  Eyes: Negative for photophobia and visual disturbance.  Respiratory: Negative for cough, chest tightness, shortness of breath, wheezing and stridor.  Cardiovascular: Negative for chest pain, palpitations and leg swelling.  Gastrointestinal: Negative for nausea, vomiting, abdominal pain, diarrhea, constipation, blood in stool, abdominal distention and anal bleeding.   Genitourinary: Negative for dysuria, hematuria, flank pain and difficulty urinating.  Musculoskeletal: Negative for myalgias, back pain, joint swelling, arthralgias and gait problem.  Skin: Negative for color change, pallor, rash and wound.  Neurological: Negative for dizziness, tremors, weakness and light-headedness.  Hematological: Negative for adenopathy. Does not bruise/bleed easily.  Psychiatric/Behavioral: Negative for behavioral problems, confusion, sleep disturbance, dysphoric mood, decreased concentration and agitation.   Physical Exam   Ht 5' 7 (1.702 m)   Wt 215 lb (97.5 kg)   BMI 33.67 kg/m   Physical Exam  Constitutional: He is oriented to person, place, and time. He appears well-developed and well-nourished. No distress.  HENT:  Mouth/Throat: Oropharynx is clear and moist. No oropharyngeal exudate.  Cardiovascular: Normal rate, regular rhythm and normal heart sounds. Exam reveals no gallop and no friction rub.  No murmur heard.  Pulmonary/Chest: Effort normal and breath sounds normal. No respiratory distress. He has no wheezes.  Lymphadenopathy:  He has no cervical adenopathy.  Neurological: He is alert and oriented to person, place, and time.  Skin: Skin is warm and dry. No rash noted. No erythema.  Psychiatric: He has a normal mood and affect. His behavior is normal.   Lab Results  Component Value Date   CD4TCELL 49 07/02/2024   Lab Results  Component Value Date   CD4TABS 1,074 07/02/2024   CD4TABS 944 01/02/2024   CD4TABS 640 06/08/2023   Lab Results  Component Value Date   HIV1RNAQUANT NOT DETECTED 07/02/2024   Lab Results  Component Value Date   HEPBSAB POS (A) 06/05/2014   Lab Results  Component Value Date   LABRPR NON-REACTIVE 01/02/2024  CBC Lab Results  Component Value Date   WBC 4.3 07/02/2024   RBC 4.51 07/02/2024   HGB 14.5 07/02/2024   HCT 43.8 07/02/2024   PLT 288 07/02/2024   MCV 97.1 07/02/2024   MCH 32.2 07/02/2024   MCHC  33.1 07/02/2024   RDW 12.7 07/02/2024   LYMPHSABS 1,520 06/08/2023   MONOABS 376 11/29/2016   EOSABS 69 07/02/2024    BMET Lab Results  Component Value Date   NA 138 07/02/2024   K 4.7 07/02/2024   CL 106 07/02/2024   CO2 24 07/02/2024   GLUCOSE 99 07/02/2024   BUN 21 07/02/2024   CREATININE 1.36 (H) 07/02/2024   CALCIUM 9.5 07/02/2024   GFRNONAA 65 03/02/2021   GFRAA 76 03/02/2021      Assessment and Plan  HIV disease= continue with current regimen. Reviewed lab results -- which shows that he is still well controlled  Long term medication management = cr is stable. No need to change  Ckd 2 = wil lcheck ua to see if their is proteinuria  Health maintenance =  Flu shot

## 2024-08-16 LAB — URINALYSIS, ROUTINE W REFLEX MICROSCOPIC
Bilirubin Urine: NEGATIVE
Glucose, UA: NEGATIVE
Hgb urine dipstick: NEGATIVE
Ketones, ur: NEGATIVE
Leukocytes,Ua: NEGATIVE
Nitrite: NEGATIVE
Protein, ur: NEGATIVE
Specific Gravity, Urine: 1.022 (ref 1.001–1.035)
pH: <=5 — AB (ref 5.0–8.0)

## 2024-08-21 ENCOUNTER — Encounter: Payer: Self-pay | Admitting: Internal Medicine

## 2024-12-09 ENCOUNTER — Encounter (INDEPENDENT_AMBULATORY_CARE_PROVIDER_SITE_OTHER): Payer: Self-pay

## 2025-02-04 ENCOUNTER — Other Ambulatory Visit

## 2025-02-18 ENCOUNTER — Ambulatory Visit: Payer: Self-pay | Admitting: Internal Medicine
